# Patient Record
Sex: Female | Born: 1975
Health system: Southern US, Community
[De-identification: ages and names within clinical notes are randomized; demographics above are authoritative.]

## PROBLEM LIST (undated history)

## (undated) ENCOUNTER — Inpatient Hospital Stay (HOSPITAL_COMMUNITY): Payer: Self-pay

## (undated) DIAGNOSIS — R87613 High grade squamous intraepithelial lesion on cytologic smear of cervix (HGSIL): Secondary | ICD-10-CM

## (undated) DIAGNOSIS — Z973 Presence of spectacles and contact lenses: Secondary | ICD-10-CM

## (undated) DIAGNOSIS — D060 Carcinoma in situ of endocervix: Secondary | ICD-10-CM

## (undated) DIAGNOSIS — Z87442 Personal history of urinary calculi: Secondary | ICD-10-CM

## (undated) HISTORY — PX: TONSILLECTOMY: SUR1361

---

## 2002-03-21 ENCOUNTER — Other Ambulatory Visit: Admission: RE | Admit: 2002-03-21 | Discharge: 2002-03-21 | Payer: Self-pay | Admitting: Obstetrics & Gynecology

## 2002-09-24 ENCOUNTER — Encounter: Payer: Self-pay | Admitting: Obstetrics and Gynecology

## 2002-09-24 ENCOUNTER — Ambulatory Visit (HOSPITAL_COMMUNITY): Admission: RE | Admit: 2002-09-24 | Discharge: 2002-09-24 | Payer: Self-pay | Admitting: Obstetrics and Gynecology

## 2003-03-31 ENCOUNTER — Other Ambulatory Visit: Admission: RE | Admit: 2003-03-31 | Discharge: 2003-03-31 | Payer: Self-pay | Admitting: Obstetrics and Gynecology

## 2003-11-03 ENCOUNTER — Inpatient Hospital Stay (HOSPITAL_COMMUNITY): Admission: AD | Admit: 2003-11-03 | Discharge: 2003-11-06 | Payer: Self-pay | Admitting: Obstetrics and Gynecology

## 2003-11-13 ENCOUNTER — Encounter: Admission: RE | Admit: 2003-11-13 | Discharge: 2003-12-13 | Payer: Self-pay | Admitting: Obstetrics and Gynecology

## 2011-06-11 ENCOUNTER — Encounter: Payer: Self-pay | Admitting: *Deleted

## 2011-06-11 ENCOUNTER — Ambulatory Visit (INDEPENDENT_AMBULATORY_CARE_PROVIDER_SITE_OTHER): Payer: BC Managed Care – PPO

## 2011-06-11 ENCOUNTER — Emergency Department (HOSPITAL_BASED_OUTPATIENT_CLINIC_OR_DEPARTMENT_OTHER)
Admission: EM | Admit: 2011-06-11 | Discharge: 2011-06-11 | Disposition: A | Discharge status: Home / Self Care | Payer: BC Managed Care – PPO | Attending: Emergency Medicine | Admitting: Emergency Medicine

## 2011-06-11 ENCOUNTER — Emergency Department (INDEPENDENT_AMBULATORY_CARE_PROVIDER_SITE_OTHER): Payer: BC Managed Care – PPO

## 2011-06-11 DIAGNOSIS — R112 Nausea with vomiting, unspecified: Secondary | ICD-10-CM

## 2011-06-11 DIAGNOSIS — N23 Unspecified renal colic: Secondary | ICD-10-CM

## 2011-06-11 DIAGNOSIS — R1011 Right upper quadrant pain: Secondary | ICD-10-CM

## 2011-06-11 DIAGNOSIS — N200 Calculus of kidney: Secondary | ICD-10-CM

## 2011-06-11 DIAGNOSIS — R109 Unspecified abdominal pain: Secondary | ICD-10-CM

## 2011-06-11 DIAGNOSIS — N390 Urinary tract infection, site not specified: Secondary | ICD-10-CM | POA: Insufficient documentation

## 2011-06-11 LAB — BASIC METABOLIC PANEL
BUN: 13 mg/dL (ref 6–23)
Calcium: 9.1 mg/dL (ref 8.4–10.5)
Chloride: 102 mEq/L (ref 96–112)
Creatinine, Ser: 0.6 mg/dL (ref 0.50–1.10)
GFR calc Af Amer: >90 mL/min (ref >90)
GFR calc non Af Amer: >90 mL/min (ref >90)

## 2011-06-11 LAB — DIFFERENTIAL
Basophils Absolute: 0 10*3/uL (ref 0.0–0.1)
Basophils Relative: 0 % (ref 0–1)
Eosinophils Absolute: 0 10*3/uL (ref 0.0–0.7)
Monocytes Absolute: 0.6 10*3/uL (ref 0.1–1.0)
Neutro Abs: 7.2 10*3/uL (ref 1.7–7.7)

## 2011-06-11 LAB — PREGNANCY, URINE: Preg Test, Ur: NEGATIVE

## 2011-06-11 LAB — CBC
HCT: 39.2 % (ref 36.0–46.0)
MCH: 28.7 pg (ref 26.0–34.0)
MCHC: 33.9 g/dL (ref 30.0–36.0)
RDW: 12.5 % (ref 11.5–15.5)

## 2011-06-11 LAB — URINE MICROSCOPIC-ADD ON

## 2011-06-11 LAB — URINALYSIS, ROUTINE W REFLEX MICROSCOPIC
Glucose, UA: NEGATIVE mg/dL
Protein, ur: 30 mg/dL — AB
Specific Gravity, Urine: 1.031 — ABNORMAL HIGH (ref 1.005–1.030)
pH: 6 (ref 5.0–8.0)

## 2011-06-11 MED ORDER — IBUPROFEN 600 MG PO TABS: 600.0000 mg | ORAL_TABLET | Freq: Three times a day (TID) | ORAL | Status: AC | PRN

## 2011-06-11 MED ORDER — CEPHALEXIN 250 MG PO CAPS
500.0000 mg | ORAL_CAPSULE | Freq: Four times a day (QID) | ORAL | Status: DC
  Administered 2011-06-11: 500 mg via ORAL

## 2011-06-11 MED ORDER — ONDANSETRON HCL 8 MG PO TABS: 8.0000 mg | ORAL_TABLET | Freq: Three times a day (TID) | ORAL | Status: AC | PRN

## 2011-06-11 MED ORDER — HYDROCODONE-ACETAMINOPHEN 5-325 MG PO TABS: 2.0000 | ORAL_TABLET | ORAL | Status: AC | PRN

## 2011-06-11 MED ORDER — CEPHALEXIN 250 MG PO CAPS
ORAL_CAPSULE | ORAL | Status: AC
  Administered 2011-06-11: 500 mg via ORAL
  Filled 2011-06-11: qty 2

## 2011-06-11 MED ORDER — CEPHALEXIN 500 MG PO CAPS: 500.0000 mg | ORAL_CAPSULE | Freq: Four times a day (QID) | ORAL | Status: AC

## 2011-06-11 MED ORDER — HYDROCODONE-ACETAMINOPHEN 5-325 MG PO TABS
2.0000 | ORAL_TABLET | Freq: Once | ORAL | Status: AC
  Administered 2011-06-11: 2 via ORAL

## 2011-06-11 MED ORDER — HYDROCODONE-ACETAMINOPHEN 5-325 MG PO TABS
ORAL_TABLET | ORAL | Status: AC
  Administered 2011-06-11: 2 via ORAL
  Filled 2011-06-11: qty 2

## 2011-06-11 NOTE — ED Notes (Signed)
Pt sent here from Urgent Care in [place] for / kidney stone. Had KUB, urine and urine preg done there. C/O lower back and abd pain, nausea. Given Toradol and Zofran at the UC.

## 2011-06-11 NOTE — ED Notes (Signed)
At d/c pt requested medication for pain- vorb from [facility] obtained for hydrocodone at bedside- pt d/c home with ride- rx x 4 for zofran, hydrocodone, keflex and ibuprofen given

## 2011-06-11 NOTE — ED Provider Notes (Signed)
History   This chart was scribed for Felisa Bonier, MD by Charolett Bumpers . The patient was seen in room MH12/MH12 and the patient's care was started at 8:05pm.  CSN: [id number]  Arrival date & time 06/11/11  1901   First MD Initiated Contact with Patient 06/11/11 1954      Chief Complaint  Patient presents with  . Abdominal Pain    (Consider location/radiation/quality/duration/timing/severity/associated sxs/prior treatment) HPI Taylor Chaney is a 36 y.o. female who presents to the Emergency Department complaining of constant, moderate pain in her lower abdomen that radiates to the lower back bilaterally that started last night. She reports having associated vomiting, diarrhea, and nausea. Patient describes the pain as sharp and radiating. Patient denies dysuria, hematuria, and urinary urgency. Patient notes having a h/o kidney stones.    Past Medical History  Diagnosis Date  . Kidney stone     History reviewed. No pertinent past surgical history.  History reviewed. No pertinent family history.  History  Substance Use Topics  . Smoking status: Never Smoker   . Smokeless tobacco: Not on file  . Alcohol Use: Yes    OB History    Grav Para Term Preterm Abortions TAB SAB Ect Mult Living                  Review of Systems A complete 10 system review of systems was obtained and is otherwise negative except as noted in the HPI and PMH.   Allergies  Review of patient's allergies indicates no known allergies.  Home Medications   Current Outpatient Rx  Name Route Sig Dispense Refill  . KETOROLAC TROMETHAMINE 30 MG/ML IJ SOLN Intravenous Inject 30 mg into the vein once.      Darlis Loan ESTRAD TRIPHASIC 0.18/0.215/0.25 MG-35 MCG PO TABS Oral Take 1 tablet by mouth daily.        BP 108/70  Pulse 92  Temp(Src) 97.6 F (36.4 C) (Oral)  Resp 16  Ht 4\' 10"  (1.473 m)  Wt 96 lb (43.545 kg)  BMI 20.06 kg/m2  SpO2 100%  LMP 05/24/2011  Physical Exam    Nursing note and vitals reviewed. Constitutional: She is oriented to person, place, and time. She appears well-developed and well-nourished. No distress.  HENT:  Head: Normocephalic and atraumatic.  Eyes: EOM are normal. Pupils are equal, round, and reactive to light.  Neck: Neck supple. No tracheal deviation present.  Cardiovascular: Normal rate, regular rhythm and normal heart sounds.   Pulmonary/Chest: Effort normal and breath sounds normal. No respiratory distress.       Clear lung sounds bilaterally  Abdominal: Soft. Bowel sounds are normal. She exhibits no distension. There is tenderness (Mid-abdominal tenderness bilaterally). There is no rebound and no guarding.       CVA tenderness bilaterally.  Musculoskeletal: Normal range of motion. She exhibits no edema.  Neurological: She is alert and oriented to person, place, and time. No sensory deficit.  Skin: Skin is warm and dry.  Psychiatric: She has a normal mood and affect. Her behavior is normal.    ED Course  Procedures (including critical care time)  DIAGNOSTIC STUDIES: Oxygen Saturation is 100% on room air, normal by my interpretation.    COORDINATION OF CARE:     Labs Reviewed  URINALYSIS, ROUTINE W REFLEX MICROSCOPIC - Abnormal; Notable for the following:    Color, Urine AMBER (*) BIOCHEMICALS MAY BE AFFECTED BY COLOR   APPearance CLOUDY (*)    Specific Gravity, Urine 1.031 (*)  Hgb urine dipstick SMALL (*)    Bilirubin Urine SMALL (*)    Ketones, ur 40 (*)    Protein, ur 30 (*)    Leukocytes, UA SMALL (*)    All other components within normal limits  CBC - Abnormal; Notable for the following:    Platelets 404 (*)    All other components within normal limits  DIFFERENTIAL - Abnormal; Notable for the following:    Neutrophils Relative 84 (*)    Lymphocytes Relative 9 (*)    All other components within normal limits  URINE MICROSCOPIC-ADD ON - Abnormal; Notable for the following:    Squamous Epithelial /  LPF MANY (*)    Bacteria, UA MANY (*)    All other components within normal limits  PREGNANCY, URINE  BASIC METABOLIC PANEL   Ct Abdomen Pelvis Wo Contrast  06/11/2011  *RADIOLOGY REPORT*  Clinical Data: Bilateral flank pain.  History of urinary tract calculi.  Nausea and vomiting.  CT ABDOMEN AND PELVIS WITHOUT CONTRAST 06/11/2011:  Technique:  Multidetector CT imaging of the abdomen and pelvis was performed following the standard protocol without intravenous contrast.  Comparison: None.  Findings: Hyperdense renal tubules involving both kidneys, with a very small (approximate 2-3 mm) calculus in a lower pole calix of the right kidney, and very small (1-2 mm) calculi in mid and lower pole calyces of the left kidney.  No evidence of ureteral calculi or urinary tract obstruction on either side.  Phleboliths in the lower left pelvis.  Within the limits of the unenhanced technique, no focal parenchymal abnormality involving either kidney.  Normal low dose unenhanced appearance of the liver, spleen, pancreas, and adrenal glands.  Anatomic variant in that the left lobe of the liver extends well across the midline into the left upper quadrant.  Gallbladder contracted, as there is food within the normal-appearing stomach.  No biliary ductal dilation.  No visible aorto-iliofemoral atherosclerosis.  No significant lymphadenopathy.  Normal-appearing stomach and small bowel.  Liquid stool within normal appearing colon.  Appendix not clearly identified, but no pericecal inflammation.  No ascites.  Urinary bladder decompressed and unremarkable.  Uterus and ovaries unremarkable for age.  Bone window images unremarkable.  Visualized lung bases clear.  Heart size normal.  IMPRESSION:  1.  Non-obstructing very small bilateral renal calculi.  No evidence of urinary tract obstruction on either side. 2.  Hyperdense renal tubules consistent with renal tubular ectasia. 3.  No acute abnormalities involving the abdomen or pelvis.   Original Report Authenticated By: Arnell Sieving, M.D.     No diagnosis found.    MDM  Kidney stone vs. Urinary tract infection   I personally performed the services described in this documentation, which was scribed in my presence. The recorded information has been reviewed and considered.       Felisa Bonier, MD 06/11/11 2227

## 2011-06-13 LAB — URINE CULTURE
Colony Count: NO GROWTH
Culture  Setup Time: 201301070205
Culture: NO GROWTH

## 2012-06-05 NOTE — L&D Delivery Note (Signed)
Operative Delivery Note At 2:29 PM a viable and healthy female was delivered via Vaginal, Vacuum [employer], operational).  Presentation: vertex; Position: Left,, Occiput,, Anterior; Station: +4.  Verbal consent: obtained from patient.  Risks and benefits discussed in detail.  Risks include, but are not limited to the risks of anesthesia, bleeding, infection, damage to maternal tissues, fetal cephalhematoma.  There is also the risk of inability to effect vaginal delivery of the head, or shoulder dystocia that cannot be resolved by established maneuvers, leading to the need for emergency cesarean section.  APGAR: 8, 9; weight pending. Placenta status: Intact, Spontaneous.   Cord: 3 vessels with the following complications: None.  Cord pH: na  Anesthesia: Epidural  Instruments: Kiwi cup x 2pulls and one pop off Episiotomy: Median Lacerations: None Suture Repair: 2.0 vicryl rapide Est. Blood Loss (mL): 400  Mom to postpartum.  Baby to nursery-stable.  Quinlin Conant J 02/05/2013, 2:47 PM

## 2012-12-07 ENCOUNTER — Inpatient Hospital Stay (HOSPITAL_COMMUNITY)
Admission: AD | Admit: 2012-12-07 | Discharge: 2012-12-07 | Disposition: A | Discharge status: Home / Self Care | Payer: BC Managed Care – PPO | Source: Ambulatory Visit | Attending: Obstetrics & Gynecology | Admitting: Obstetrics & Gynecology

## 2012-12-07 ENCOUNTER — Encounter (HOSPITAL_COMMUNITY): Payer: Self-pay | Admitting: *Deleted

## 2012-12-07 ENCOUNTER — Inpatient Hospital Stay (HOSPITAL_COMMUNITY): Payer: BC Managed Care – PPO

## 2012-12-07 DIAGNOSIS — O4410 Placenta previa with hemorrhage, unspecified trimester: Secondary | ICD-10-CM | POA: Insufficient documentation

## 2012-12-07 LAB — CBC
MCHC: 33.5 g/dL (ref 30.0–36.0)
Platelets: 275 10*3/uL (ref 150–400)
RDW: 14.1 % (ref 11.5–15.5)
WBC: 11.9 10*3/uL — ABNORMAL HIGH (ref 4.0–10.5)

## 2012-12-07 LAB — URINALYSIS, ROUTINE W REFLEX MICROSCOPIC
Bilirubin Urine: NEGATIVE
Ketones, ur: 15 mg/dL — AB
Leukocytes, UA: NEGATIVE
Nitrite: NEGATIVE
Protein, ur: NEGATIVE mg/dL
pH: 6.5 (ref 5.0–8.0)

## 2012-12-07 LAB — URINE MICROSCOPIC-ADD ON

## 2012-12-07 MED ORDER — METRONIDAZOLE 500 MG PO TABS: 500.0000 mg | ORAL_TABLET | Freq: Two times a day (BID) | ORAL | Status: DC

## 2012-12-07 MED ORDER — TERCONAZOLE 0.4 % VA CREA: 1.0000 | TOPICAL_CREAM | Freq: Every day | VAGINAL | Status: DC

## 2012-12-07 MED ORDER — LACTATED RINGERS IV BOLUS (SEPSIS)
1000.0000 mL | Freq: Once | INTRAVENOUS | Status: AC
  Administered 2012-12-07: 1000 mL via INTRAVENOUS

## 2012-12-07 MED ORDER — FERROUS SULFATE 325 (65 FE) MG PO TABS: 325.0000 mg | ORAL_TABLET | Freq: Every day | ORAL | Status: DC

## 2012-12-07 NOTE — MAU Provider Note (Signed)
S: 37 y.o. G2P1 @[redacted]w[redacted]d  presents to MAU with report of bright red vaginal bleeding at home this morning,enough to fill the toilet and when she wiped.  She reports spotting in her underwear only upon arrival in MAU. She had a known placenta previa which resolved to low-lying placenta on recent ultrasound.  She reports good fetal movement, denies LOF, vaginal itching/burning, urinary symptoms, h/a, dizziness, n/v, or fever/chills.    O: BP 110/77  Pulse 90  Physical Examination: General appearance - alert, well appearing, and in no distress and oriented to person, place, and time Chest - clear to auscultation, no wheezes, rales or rhonchi, symmetric air entry Heart - normal rate, regular rhythm, normal S1, S2, no murmurs, rubs, clicks or gallops  FHR baseline 120 with moderate variability, accels present (15x15), no decels Occasional contraction on toco, mild to palpation  A: Vaginal bleeding with low-lying placenta  P: Medical screening performed RN to call pt attending Labs and  U/S pending  Sharen Counter Certified Nurse-Midwife

## 2012-12-07 NOTE — H&P (Addendum)
37 yo G5P1031 (MAB x 2, IUI conception w/ SVD, MAB, current pregnancy) at 30'1 for evaluation of vaginal bleeding. Pt notes waking up at 5:30 am to void and noticed pink in toilet water. Pt called after hours and told to come in. Good FM, feels very rare/ short contraction at night. No ROM. No vag d/c. No abdominal pain. Just bleeding early this am, IC last Thursday- 2 days ago.   PN Issues: - placenta previa, moved to low lying placenta vs partial previa at 28 wks and repeat u/s planned. - O positive  PMH: none PSH: none PGynHx: no h/o endometriosis, HPV on pap this pregnancy, planning eval PP Meds: PNV All: NKDA SH: getting married tomorrow  PE: Filed Vitals:   12/07/12 0614  BP: 110/77  Pulse: 90   Gen: well appearng, no distress CV: RRR Pulm: CTAB Abd: soft, gravid, NT, no RUQ pain LE: NT, no edema GU: mod amount thicker white d/c, not particularly malodorous, no blood, no pink, cvx visually long/ closed  Wet prep: numerous WBC, no yeast seen, no trich, clue cells present- 80%, rare lactobacilli, no RBC seen, sperm present Toco: very rare ctx FH: 140's, + accels, no decels, 10 beat var  A/P: 37 yo [id number] with c/o vaginal spotting and h/o low lying placenta - still w/ low lying placenta, no evidence previa, cvx long. No evidence PTL. No evidence abruption. No abdominal pain. Likely related to recent IC and possibly dehydration. Given pt will be busy today and tomorrow with rehearsal dinner and wedding, will give 1L of fluids. Pt advised to drink as much as possible and rest when she can. - mild anemia. Iron recc - BV. Will treat, will give Terazol to prevent yeast. Check GC/Ct given WBC on wet prep - Reactive fetal testing.   Javed Cotto A. 12/07/2012 9:13 AM   Prior to discharge pt had another void with significant hematuria. Unclear whether genital or urinary in etiology. Further history obtained, no back pain but h/o kidney stone in past. Repeat SSE done, no blood in  vagina, no cervical bleeding. Cath UA done and blood noted. Probably small kidney stone causing hematuria. Will send UA, Ucx, pt advised for aggressive hydration, return if pain.  Cath UA: verbal consent obtained, betadine on urethra, sterile gloves, small cath inserted and sample obtained. Pt tol well.   Zarria Towell A. 12/07/2012 10:15 AM

## 2012-12-07 NOTE — MAU Note (Signed)
Pt reports vaginal bleeding when she awakened at 0530, no pain. History of low lying placenta.

## 2012-12-08 LAB — GC/CHLAMYDIA PROBE AMP
CT Probe RNA: NEGATIVE
GC Probe RNA: NEGATIVE

## 2012-12-08 LAB — URINE CULTURE: Culture: NO GROWTH

## 2013-02-05 ENCOUNTER — Encounter (HOSPITAL_COMMUNITY): Payer: Self-pay | Admitting: Anesthesiology

## 2013-02-05 ENCOUNTER — Encounter (HOSPITAL_COMMUNITY): Payer: Self-pay

## 2013-02-05 ENCOUNTER — Inpatient Hospital Stay (HOSPITAL_COMMUNITY): Payer: BC Managed Care – PPO | Admitting: Anesthesiology

## 2013-02-05 ENCOUNTER — Inpatient Hospital Stay (HOSPITAL_COMMUNITY)
Admission: AD | Admit: 2013-02-05 | Discharge: 2013-02-07 | DRG: 373 | Disposition: A | Discharge status: Home / Self Care | Payer: BC Managed Care – PPO | Source: Ambulatory Visit | Attending: Obstetrics and Gynecology | Admitting: Obstetrics and Gynecology

## 2013-02-05 DIAGNOSIS — G35 Multiple sclerosis: Secondary | ICD-10-CM | POA: Diagnosis present

## 2013-02-05 DIAGNOSIS — D62 Acute posthemorrhagic anemia: Secondary | ICD-10-CM | POA: Diagnosis not present

## 2013-02-05 DIAGNOSIS — O09529 Supervision of elderly multigravida, unspecified trimester: Secondary | ICD-10-CM | POA: Diagnosis present

## 2013-02-05 DIAGNOSIS — Z86711 Personal history of pulmonary embolism: Secondary | ICD-10-CM

## 2013-02-05 DIAGNOSIS — O9903 Anemia complicating the puerperium: Secondary | ICD-10-CM | POA: Diagnosis not present

## 2013-02-05 LAB — CBC
MCHC: 34.8 g/dL (ref 30.0–36.0)
Platelets: 301 10*3/uL (ref 150–400)
RDW: 14.8 % (ref 11.5–15.5)
WBC: 12.6 10*3/uL — ABNORMAL HIGH (ref 4.0–10.5)

## 2013-02-05 LAB — OB RESULTS CONSOLE RUBELLA ANTIBODY, IGM: Rubella: IMMUNE

## 2013-02-05 LAB — OB RESULTS CONSOLE GC/CHLAMYDIA
Chlamydia: NEGATIVE
Gonorrhea: NEGATIVE

## 2013-02-05 LAB — RPR: RPR Ser Ql: NONREACTIVE

## 2013-02-05 LAB — TYPE AND SCREEN
ABO/RH(D): O POS
Antibody Screen: NEGATIVE

## 2013-02-05 LAB — OB RESULTS CONSOLE HEPATITIS B SURFACE ANTIGEN: Hepatitis B Surface Ag: NEGATIVE

## 2013-02-05 LAB — OB RESULTS CONSOLE HIV ANTIBODY (ROUTINE TESTING): HIV: NONREACTIVE

## 2013-02-05 MED ORDER — METHYLERGONOVINE MALEATE 0.2 MG PO TABS: 0.2000 mg | ORAL_TABLET | ORAL | Status: DC | PRN

## 2013-02-05 MED ORDER — PHENYLEPHRINE 40 MCG/ML (10ML) SYRINGE FOR IV PUSH (FOR BLOOD PRESSURE SUPPORT)
80.0000 ug | PREFILLED_SYRINGE | INTRAVENOUS | Status: DC | PRN
  Filled 2013-02-05: qty 2
  Filled 2013-02-05: qty 5

## 2013-02-05 MED ORDER — IBUPROFEN 600 MG PO TABS
600.0000 mg | ORAL_TABLET | Freq: Four times a day (QID) | ORAL | Status: DC
  Administered 2013-02-06 – 2013-02-07 (×6): 600 mg via ORAL
  Filled 2013-02-05 (×6): qty 1

## 2013-02-05 MED ORDER — PRENATAL MULTIVITAMIN CH
1.0000 | ORAL_TABLET | Freq: Every day | ORAL | Status: DC
  Administered 2013-02-06: 1 via ORAL
  Filled 2013-02-05: qty 1

## 2013-02-05 MED ORDER — ONDANSETRON HCL 4 MG PO TABS: 4.0000 mg | ORAL_TABLET | ORAL | Status: DC | PRN

## 2013-02-05 MED ORDER — CITRIC ACID-SODIUM CITRATE 334-500 MG/5ML PO SOLN: 30.0000 mL | ORAL | Status: DC | PRN

## 2013-02-05 MED ORDER — DIBUCAINE 1 % RE OINT: 1.0000 "application " | TOPICAL_OINTMENT | RECTAL | Status: DC | PRN

## 2013-02-05 MED ORDER — EPHEDRINE 5 MG/ML INJ
10.0000 mg | INTRAVENOUS | Status: DC | PRN
  Filled 2013-02-05: qty 2
  Filled 2013-02-05: qty 4

## 2013-02-05 MED ORDER — OXYCODONE-ACETAMINOPHEN 5-325 MG PO TABS
1.0000 | ORAL_TABLET | ORAL | Status: DC | PRN
  Administered 2013-02-05: 2 via ORAL
  Filled 2013-02-05: qty 2

## 2013-02-05 MED ORDER — LACTATED RINGERS IV SOLN: 500.0000 mL | INTRAVENOUS | Status: DC | PRN

## 2013-02-05 MED ORDER — IBUPROFEN 600 MG PO TABS
600.0000 mg | ORAL_TABLET | Freq: Four times a day (QID) | ORAL | Status: DC | PRN
  Administered 2013-02-05: 600 mg via ORAL
  Filled 2013-02-05: qty 1

## 2013-02-05 MED ORDER — BUTORPHANOL TARTRATE 1 MG/ML IJ SOLN: 1.0000 mg | INTRAMUSCULAR | Status: DC | PRN

## 2013-02-05 MED ORDER — FENTANYL 2.5 MCG/ML BUPIVACAINE 1/10 % EPIDURAL INFUSION (WH - ANES)
14.0000 mL/h | INTRAMUSCULAR | Status: DC | PRN
  Administered 2013-02-05: 12 mL/h via EPIDURAL
  Administered 2013-02-05: 14 mL/h via EPIDURAL
  Filled 2013-02-05 (×2): qty 125

## 2013-02-05 MED ORDER — SENNOSIDES-DOCUSATE SODIUM 8.6-50 MG PO TABS
2.0000 | ORAL_TABLET | Freq: Every day | ORAL | Status: DC
  Administered 2013-02-05 – 2013-02-06 (×2): 2 via ORAL

## 2013-02-05 MED ORDER — OXYTOCIN 40 UNITS IN LACTATED RINGERS INFUSION - SIMPLE MED
62.5000 mL/h | INTRAVENOUS | Status: DC
  Filled 2013-02-05: qty 1000

## 2013-02-05 MED ORDER — OXYTOCIN BOLUS FROM INFUSION: 500.0000 mL | INTRAVENOUS | Status: DC

## 2013-02-05 MED ORDER — DIPHENHYDRAMINE HCL 50 MG/ML IJ SOLN: 12.5000 mg | INTRAMUSCULAR | Status: DC | PRN

## 2013-02-05 MED ORDER — LACTATED RINGERS IV SOLN
INTRAVENOUS | Status: DC
  Administered 2013-02-05 (×2): via INTRAVENOUS

## 2013-02-05 MED ORDER — LACTATED RINGERS IV SOLN
500.0000 mL | Freq: Once | INTRAVENOUS | Status: AC
  Administered 2013-02-05: 500 mL via INTRAVENOUS

## 2013-02-05 MED ORDER — SIMETHICONE 80 MG PO CHEW: 80.0000 mg | CHEWABLE_TABLET | ORAL | Status: DC | PRN

## 2013-02-05 MED ORDER — OXYCODONE-ACETAMINOPHEN 5-325 MG PO TABS
1.0000 | ORAL_TABLET | ORAL | Status: DC | PRN
  Administered 2013-02-05: 2 via ORAL
  Administered 2013-02-06 – 2013-02-07 (×3): 1 via ORAL
  Filled 2013-02-05 (×2): qty 1
  Filled 2013-02-05: qty 2
  Filled 2013-02-05: qty 1

## 2013-02-05 MED ORDER — DIPHENHYDRAMINE HCL 25 MG PO CAPS: 25.0000 mg | ORAL_CAPSULE | Freq: Four times a day (QID) | ORAL | Status: DC | PRN

## 2013-02-05 MED ORDER — BENZOCAINE-MENTHOL 20-0.5 % EX AERO
1.0000 "application " | INHALATION_SPRAY | CUTANEOUS | Status: DC | PRN
  Filled 2013-02-05: qty 56

## 2013-02-05 MED ORDER — TETANUS-DIPHTH-ACELL PERTUSSIS 5-2.5-18.5 LF-MCG/0.5 IM SUSP: 0.5000 mL | Freq: Once | INTRAMUSCULAR | Status: DC

## 2013-02-05 MED ORDER — TERBUTALINE SULFATE 1 MG/ML IJ SOLN: 0.2500 mg | Freq: Once | INTRAMUSCULAR | Status: DC | PRN

## 2013-02-05 MED ORDER — FLEET ENEMA 7-19 GM/118ML RE ENEM: 1.0000 | ENEMA | RECTAL | Status: DC | PRN

## 2013-02-05 MED ORDER — ZOLPIDEM TARTRATE 5 MG PO TABS: 5.0000 mg | ORAL_TABLET | Freq: Every evening | ORAL | Status: DC | PRN

## 2013-02-05 MED ORDER — WITCH HAZEL-GLYCERIN EX PADS: 1.0000 "application " | MEDICATED_PAD | CUTANEOUS | Status: DC | PRN

## 2013-02-05 MED ORDER — EPHEDRINE 5 MG/ML INJ
10.0000 mg | INTRAVENOUS | Status: DC | PRN
  Filled 2013-02-05: qty 2

## 2013-02-05 MED ORDER — ONDANSETRON HCL 4 MG/2ML IJ SOLN: 4.0000 mg | INTRAMUSCULAR | Status: DC | PRN

## 2013-02-05 MED ORDER — OXYTOCIN 40 UNITS IN LACTATED RINGERS INFUSION - SIMPLE MED
1.0000 m[IU]/min | INTRAVENOUS | Status: DC
  Administered 2013-02-05: 2 m[IU]/min via INTRAVENOUS

## 2013-02-05 MED ORDER — LIDOCAINE HCL (PF) 1 % IJ SOLN
INTRAMUSCULAR | Status: DC | PRN
  Administered 2013-02-05: 4 mL
  Administered 2013-02-05: 2 mL
  Administered 2013-02-05 (×2): 4 mL

## 2013-02-05 MED ORDER — METHYLERGONOVINE MALEATE 0.2 MG/ML IJ SOLN: 0.2000 mg | INTRAMUSCULAR | Status: DC | PRN

## 2013-02-05 MED ORDER — PHENYLEPHRINE 40 MCG/ML (10ML) SYRINGE FOR IV PUSH (FOR BLOOD PRESSURE SUPPORT)
80.0000 ug | PREFILLED_SYRINGE | INTRAVENOUS | Status: DC | PRN
  Filled 2013-02-05: qty 2

## 2013-02-05 MED ORDER — ONDANSETRON HCL 4 MG/2ML IJ SOLN: 4.0000 mg | Freq: Four times a day (QID) | INTRAMUSCULAR | Status: DC | PRN

## 2013-02-05 MED ORDER — LIDOCAINE HCL (PF) 1 % IJ SOLN
30.0000 mL | INTRAMUSCULAR | Status: AC | PRN
  Administered 2013-02-05: 30 mL via SUBCUTANEOUS
  Filled 2013-02-05 (×2): qty 30

## 2013-02-05 MED ORDER — LANOLIN HYDROUS EX OINT: TOPICAL_OINTMENT | CUTANEOUS | Status: DC | PRN

## 2013-02-05 MED ORDER — ACETAMINOPHEN 325 MG PO TABS: 650.0000 mg | ORAL_TABLET | ORAL | Status: DC | PRN

## 2013-02-05 NOTE — H&P (Signed)
Taylor Chaney, Taylor Chaney               ACCOUNT NO.:  [id number]  MEDICAL RECORD NO.:  [id number]  LOCATION:  9109                          FACILITY:  [facility]  PHYSICIAN:  Lenoard Aden, M.D.DATE OF BIRTH:  1975/07/06  DATE OF ADMISSION:  02/05/2013 DATE OF DISCHARGE:                             HISTORY & PHYSICAL   CHIEF COMPLAINT:  Labor.  HISTORY OF PRESENT ILLNESS:  A 37 year old Asian female, G5, P1 at 60 and 5/7th weeks' gestation who presents in active labor.  MEDICATIONS:  Prenatal vitamins and iron.  ALLERGIES:  She has no known drug allergies.  SOCIAL HISTORY:  Noncontributory.  FAMILY HISTORY:  Noncontributory.  MEDICAL HISTORY:  Remarkable for multiple sclerosis.  SURGICAL HISTORY:  Noncontributory.  Pregnancy is complicated by acute pulmonary embolism.   CANCELLED DICTATION.     Lenoard Aden, M.D.     RJT/MEDQ  D:  02/05/2013  T:  02/05/2013  Job:  [id number]

## 2013-02-05 NOTE — H&P (Signed)
Taylor Chaney, Taylor Chaney               ACCOUNT NO.:  [id number]  MEDICAL RECORD NO.:  [id number]  LOCATION:  9109                          FACILITY:  [facility]  PHYSICIAN:  Lenoard Aden, M.D.DATE OF BIRTH:  [birth year]  DATE OF ADMISSION:  02/05/2013 DATE OF DISCHARGE:                             HISTORY & PHYSICAL   CHIEF COMPLAINT:  Labor.  HISTORY OF PRESENT ILLNESS:  A 37 year old Asian female G5, P1, at 51- 5/7th weeks, in active labor.  ALLERGIES:  She has no known drug allergies.  MEDICATIONS:  Prenatal vitamins and iron.  SOCIAL HISTORY:  She is a nonsmoker, nondrinker.  She denies domestic or physical violence.  FAMILY HISTORY:  Noncontributory.  OBSTETRICAL HISTORY:  Pregnancy is remarkable for 3 SABs and 1 uncomplicated vaginal delivery.  PAST SURGICAL HISTORY:  Noncontributory.  PRENATAL COURSE:  Complicated by placenta previa which has resolved at 34 weeks.  PHYSICAL EXAMINATION:  GENERAL:  She is a well-developed, well-nourished female, in no acute distress. HEENT:  Normal. NECK:  Supple.  Full range of motion. LUNGS:  Clear. HEART:  Regular rate and rhythm. ABDOMEN:  Soft, gravid, nontender.  Estimated fetal weight 7.5 pounds. Cervix is 4 to 5 cm, 80%, vertex -1. EXTREMITIES:  There are no cords. NEUROLOGIC:  Nonfocal. SKIN:  Intact.  IMPRESSION:  Term intrauterine pregnancy, in active labor.  PLAN:  Anticipate attempts at vaginal delivery.     Lenoard Aden, M.D.     RJT/MEDQ  D:  02/05/2013  T:  02/05/2013  Job:  [id number]

## 2013-02-05 NOTE — Progress Notes (Signed)
Taylor Chaney is a 37 y.o. G5P1031 at [redacted]w[redacted]d by LMP admitted for active labor  Subjective: comfortable  Objective: BP 114/67  Pulse 99  Temp(Src) 97.2 F (36.2 C) (Axillary)  Resp 18  Ht 4\' 10"  (1.473 m)  Wt 61.689 kg (136 lb)  BMI 28.43 kg/m2  SpO2 99%      FHT:  FHR: 155 bpm, variability: moderate,  accelerations:  Present,  decelerations:  Absent UC:   irregular, every 2-4 minutes SVE:   Dilation: 6.5 Effacement (%): 90 Station: -1 Exam by:: Taylor Lebeda, MD AROM- clear  Labs: Lab Results  Component Value Date   WBC 12.6* 02/05/2013   HGB 11.9* 02/05/2013   HCT 34.2* 02/05/2013   MCV 86.1 02/05/2013   PLT 301 02/05/2013    Assessment / Plan: Spontaneous labor, progressing normally  Labor: Progressing normally Preeclampsia:  labs stable Fetal Wellbeing:  Category I Pain Control:  Epidural I/D:  n/a Anticipated MOD:  NSVD  Taylor Chaney J 02/05/2013, 8:55 AM

## 2013-02-05 NOTE — Anesthesia Procedure Notes (Signed)
Epidural Patient location during procedure: OB Start time: 02/05/2013 4:02 AM  Staffing Performed by: anesthesiologist   Preanesthetic Checklist Completed: patient identified, site marked, surgical consent, pre-op evaluation, timeout performed, IV checked, risks and benefits discussed and monitors and equipment checked  Epidural Patient position: sitting Prep: site prepped and draped and DuraPrep Patient monitoring: continuous pulse ox and blood pressure Approach: midline Injection technique: LOR air  Needle:  Needle type: Tuohy  Needle gauge: 17 G Needle length: 9 cm and 9 Needle insertion depth: 5 cm cm Catheter type: closed end flexible Catheter size: 19 Gauge Catheter at skin depth: 10 cm Test dose: negative  Assessment Events: blood not aspirated, injection not painful, no injection resistance, negative IV test and no paresthesia  Additional Notes Discussed risk of headache, infection, bleeding, nerve injury and failed or incomplete block.  Patient voices understanding and wishes to proceed.  Epidural placed easily on first attempt.  No paresthesia.  Patient tolerated procedure well with no apparent complications.  Jasmine December, MD Reason for block:procedure for pain

## 2013-02-05 NOTE — Progress Notes (Signed)
Taylor Chaney is a 37 y.o. G5P1031 at [redacted]w[redacted]d by LMP admitted for active labor  Subjective: Getting epidural  Objective: BP 100/74  Pulse 93  Temp(Src) 99 F (37.2 C) (Oral)  Resp 18  Ht 4\' 10"  (1.473 m)  Wt 61.689 kg (136 lb)  BMI 28.43 kg/m2  SpO2 99%      FHT:  FHR: 150 bpm, variability: moderate,  accelerations:  Present,  decelerations:  Absent UC:   regular, every 4 minutes SVE:   Dilation: 4.5 Effacement (%): 90 Station: -1 Exam by:: AL Rinehart RN  Labs: Lab Results  Component Value Date   WBC 12.6* 02/05/2013   HGB 11.9* 02/05/2013   HCT 34.2* 02/05/2013   MCV 86.1 02/05/2013   PLT 301 02/05/2013    Assessment / Plan: Spontaneous labor, progressing normally History of previa with resolution  Labor: Progressing normally Preeclampsia:  labs stable Fetal Wellbeing:  Category I Pain Control:  Epidural I/D:  n/a Anticipated MOD:  NSVD  Taylor Chaney J 02/05/2013, 6:52 AM

## 2013-02-05 NOTE — Progress Notes (Signed)
Satoria Dunlop is a 37 y.o. G5P1031 at [redacted]w[redacted]d by LMP admitted for active labor  Subjective: Feels pressure  Objective: BP 126/89  Pulse 98  Temp(Src) 98.3 F (36.8 C) (Oral)  Resp 18  Ht 4\' 10"  (1.473 m)  Wt 61.689 kg (136 lb)  BMI 28.43 kg/m2  SpO2 99%      FHT:  FHR: 155 bpm, variability: moderate,  accelerations:  Present,  decelerations:  Present mild variables with contractions. UC:   regular, every 2 minutes SVE:   10/100/+1  Labs: Lab Results  Component Value Date   WBC 12.6* 02/05/2013   HGB 11.9* 02/05/2013   HCT 34.2* 02/05/2013   MCV 86.1 02/05/2013   PLT 301 02/05/2013    Assessment / Plan: Spontaneous labor, progressing normally  Labor: Progressing normally Preeclampsia:  labs stable Fetal Wellbeing:  Category I Pain Control:  Epidural I/D:  n/a Anticipated MOD:  NSVD  British Moyd J 02/05/2013, 12:13 PM

## 2013-02-05 NOTE — MAU Note (Signed)
Contractions tonight. Denies LOF. Some bloody show on toilet paper and towel on bed. Positive fetal movement. Cervix was closed last week.

## 2013-02-05 NOTE — Anesthesia Preprocedure Evaluation (Signed)
Anesthesia Evaluation  Patient identified by MRN, date of birth, ID band Patient awake    Reviewed: Allergy & Precautions, H&P , NPO status , Patient's Chart, lab work & pertinent test results, reviewed documented beta blocker date and time   History of Anesthesia Complications Negative for: history of anesthetic complications  Airway Mallampati: III TM Distance: >3 FB Neck ROM: full    Dental  (+) Teeth Intact   Pulmonary neg pulmonary ROS,  breath sounds clear to auscultation        Cardiovascular negative cardio ROS  Rhythm:regular Rate:Normal     Neuro/Psych negative neurological ROS  negative psych ROS   GI/Hepatic negative GI ROS, Neg liver ROS,   Endo/Other  negative endocrine ROS  Renal/GU negative Renal ROS  negative genitourinary   Musculoskeletal   Abdominal   Peds  Hematology negative hematology ROS (+)   Anesthesia Other Findings   Reproductive/Obstetrics (+) Pregnancy                          Anesthesia Physical Anesthesia Plan  ASA: II  Anesthesia Plan: Epidural   Post-op Pain Management:    Induction:   Airway Management Planned:   Additional Equipment:   Intra-op Plan:   Post-operative Plan:   Informed Consent: I have reviewed the patients History and Physical, chart, labs and discussed the procedure including the risks, benefits and alternatives for the proposed anesthesia with the patient or authorized representative who has indicated his/her understanding and acceptance.     Plan Discussed with:   Anesthesia Plan Comments:         Anesthesia Quick Evaluation  

## 2013-02-06 DIAGNOSIS — D62 Acute posthemorrhagic anemia: Secondary | ICD-10-CM | POA: Diagnosis not present

## 2013-02-06 LAB — CBC
MCHC: 34.2 g/dL (ref 30.0–36.0)
RDW: 14.9 % (ref 11.5–15.5)

## 2013-02-06 MED ORDER — POLYSACCHARIDE IRON COMPLEX 150 MG PO CAPS
150.0000 mg | ORAL_CAPSULE | Freq: Every day | ORAL | Status: DC
  Administered 2013-02-06 – 2013-02-07 (×2): 150 mg via ORAL
  Filled 2013-02-06 (×2): qty 1

## 2013-02-06 MED ORDER — DOCUSATE SODIUM 100 MG PO CAPS
100.0000 mg | ORAL_CAPSULE | Freq: Every day | ORAL | Status: DC
  Administered 2013-02-06 – 2013-02-07 (×2): 100 mg via ORAL
  Filled 2013-02-06 (×2): qty 1

## 2013-02-06 NOTE — Anesthesia Postprocedure Evaluation (Signed)
Anesthesia Post Note  Patient: Taylor Chaney  Procedure(s) Performed: * No procedures listed *  Anesthesia type: Epidural  Patient location: Mother/Baby  Post pain: Pain level controlled  Post assessment: Post-op Vital signs reviewed  Last Vitals:  Filed Vitals:   02/06/13 0642  BP: 100/66  Pulse: 73  Temp: 36.6 C  Resp: 18    Post vital signs: Reviewed  Level of consciousness: awake  Complications: No apparent anesthesia complications

## 2013-02-06 NOTE — Progress Notes (Signed)
PPD 1 SVD  S:  Reports feeling well - a little tired             Tolerating po/ No nausea or vomiting             Bleeding is light             Pain controlled with motrin and percocet             Up ad lib / ambulatory / voiding QS  Newborn breast feeding  / Circumcision done this AM (reviewed care and anticipatory guidance for recovery)  O:               VS: BP 100/66  Pulse 73  Temp(Src) 97.9 F (36.6 C) (Oral)  Resp 18  Ht 4\' 10"  (1.473 m)  Wt 61.689 kg (136 lb)  BMI 28.43 kg/m2  SpO2 97%   LABS:              Recent Labs  02/05/13 0220 02/06/13 0605  WBC 12.6* 18.1*  HGB 11.9* 9.5*  PLT 301 237               Blood type: --/--/O POS (09/03 0700)  Rubella: Immune (09/03 1532)                            Physical Exam:             Alert and oriented X3  Lungs: Clear and unlabored  Heart: regular rate and rhythm / no mumurs  Abdomen: soft, non-tender, non-distended              Fundus: firm, non-tender, U-1  Perineum: moderate edema - ice pack in place  Lochia: light  Extremities: no edema, no calf pain or tenderness    A: PPD # 1 VAVD              Mild ABL anemia   Doing well - stable status  P: Routine post partum orders  Anticipate DC tomorrow  Marlinda Mike CNM, MSN, [facility] 02/06/2013, 9:11 AM

## 2013-02-07 MED ORDER — OXYCODONE-ACETAMINOPHEN 5-325 MG PO TABS: 1.0000 | ORAL_TABLET | ORAL | Status: DC | PRN

## 2013-02-07 MED ORDER — DSS 100 MG PO CAPS: 100.0000 mg | ORAL_CAPSULE | Freq: Two times a day (BID) | ORAL | Status: DC | PRN

## 2013-02-07 MED ORDER — IBUPROFEN 600 MG PO TABS: 600.0000 mg | ORAL_TABLET | Freq: Four times a day (QID) | ORAL | Status: DC | PRN

## 2013-02-07 NOTE — Progress Notes (Signed)
Patient ID: Taylor Chaney, female   DOB: June 25, 1975, 37 y.o.   MRN: [id number] PPD # 2  Subjective: Pt reports feeling well and eager for d/c home/ Pain controlled with ibuprofen and percocet Tolerating po/ Voiding without problems/ No n/v Bleeding is light/ Newborn info:  Information for the patient's newborn:  Taylor, Chaney [[id number]]  female Feeding: breast    Objective:  VS: Blood pressure 91/58, pulse 58, temperature 97.6 F (36.4 C), temperature source Oral, resp. rate 18, height 4\' 10"  (1.473 m), weight 61.689 kg (136 lb), SpO2 97.00%, unknown if currently breastfeeding.    Recent Labs  02/05/13 0220 02/06/13 0605  WBC 12.6* 18.1*  HGB 11.9* 9.5*  HCT 34.2* 27.8*  PLT 301 237    Blood type: --/--/O POS (09/03 0700) Rubella: Immune (09/03 1532)    Physical Exam:  General: A & O x 3  alert, cooperative and no distress CV: Regular rate and rhythm Resp: clear Abdomen: soft, nontender, normal bowel sounds Uterine Fundus: firm, below umbilicus, nontender Perineum: healing with good reapproximation Lochia: minimal Ext: edema trace and Homans sign is negative, no sign of DVT    A/P: PPD # 2/ [id number]/ S/P: VAVD with epis Mild ABL anemia Doing well and stable for discharge home RX: Ibuprofen 600mg  po Q 6 hrs prn pain #30 Refill x 1 Percocet 5/325 1 to 2 po Q 4 hrs prn pain #15 No refill Colace 100mg  BID prn constipation #30 WOB/GYN booklet given Routine pp visit in 6wks   Taylor Revel, MSN, [facility] 02/07/2013, 8:51 AM

## 2013-02-07 NOTE — Discharge Summary (Signed)
Obstetric Discharge Summary Reason for Admission: G5 P1 0 3 1 @ 38w 5d with onset active labor Prenatal Procedures: NST and ultrasound Intrapartum Procedures: vacuum and episiotomy midline Postpartum Procedures: none Complications-Operative and Postpartum: none Hemoglobin  Date Value Range Status  02/06/2013 9.5* 12.0 - 15.0 g/dL Final     DELTA CHECK NOTED     REPEATED TO VERIFY     HCT  Date Value Range Status  02/06/2013 27.8* 36.0 - 46.0 % Final    Physical Exam:  General: alert, cooperative and no distress Lochia: appropriate Uterine Fundus: firm Incision: healing well DVT Evaluation: No evidence of DVT seen on physical exam.  Discharge Diagnoses: G5 P2 0 3 2 s/p VAVD with midline episiotomy Mild ABL Anemia  Discharge Information: Date: 02/07/2013 Activity: pelvic rest Diet: routine Medications: PNV, Ibuprofen, Colace and Percocet, iron supplement Condition: stable Instructions: refer to practice specific booklet Discharge to: home Follow-up Information   Follow up with Lenoard Aden, MD In 6 weeks.   Specialty:  Obstetrics and Gynecology   Contact information:   [address] [place] [address] [phone]       Newborn Data: Live born female on 02/05/13 Birth Weight: 8 lb 0.6 oz (3646 g) APGAR: 8, 9  Home with mother.  Miriam Liles K 02/07/2013, 9:26 AM

## 2013-02-17 ENCOUNTER — Ambulatory Visit (HOSPITAL_COMMUNITY)
Admission: RE | Admit: 2013-02-17 | Discharge: 2013-02-17 | Disposition: A | Discharge status: Home / Self Care | Payer: BC Managed Care – PPO | Source: Ambulatory Visit | Attending: Obstetrics & Gynecology | Admitting: Obstetrics & Gynecology

## 2013-02-17 NOTE — Lactation Note (Signed)
Adult Lactation Consultation Outpatient Visit Note  Patient Name: Taylor Chaney Date of Birth: September 09, 1975 Gestational Age at Delivery: Unknown Type of Delivery:   Breastfeeding History: Frequency of Breastfeeding: q 2-3 hours Length of Feeding: 20  inutes Voids: QS Stools: QS  Supplementing / Method: Pumping:  Type of Pump:   Frequency:  Volume:    Comments: Has manual pump but reports that it hurts when she pumps. Encouraged to use larger flange and see if that helps    Consultation Evaluation:  Initial Feeding Assessment: Pre-feed Weight: 8- 8.2  3860g Post-feed Weight: 8-10  3912g Amount Transferred: 52 cc's Comments: Assisted mom with latch. She reports that it has been pinching but nipples are getting better. Had been cracked and sore. Now pink but intact. Wearing shells at home. Has comfort gels but reports they didn't really help. Does not want a new set. [name] wants to tuck bottom lip under. When corrected mom reports that feels fine. Encouraged to keep [name] close to the breast throughout the feeding. Lots of swallows noted.  Additional Feeding Assessment: Pre-feed Weight: 8- 10  3912g Post-feed Weight: 8- 10.2  3918g Amount Transferred:6 Comments: Assisted mom in football hold. Suggested changing positions to help nipple to heal. After the first few minutes and adjusting his bottom lip, mom reports that feels fine. [name] nursed for about 10 minutes then going to sleep. Mom had nursed him before coming to appointment for 5 minutes because he was so fussy. Reviewed sore nipple care. No further questions at present. To call prn   Total Breast milk Transferred this Visit: 58 cc's Total Supplement Given: 0  Additional Interventions:    Follow-Up With Ped To call here prn.      Pamelia Hoit 02/17/2013, 3:22 PM

## 2013-03-19 ENCOUNTER — Ambulatory Visit (HOSPITAL_COMMUNITY)
Admission: RE | Admit: 2013-03-19 | Discharge: 2013-03-19 | Disposition: A | Discharge status: Home / Self Care | Payer: BC Managed Care – PPO | Source: Ambulatory Visit | Attending: Obstetrics and Gynecology | Admitting: Obstetrics and Gynecology

## 2013-03-19 NOTE — Lactation Note (Addendum)
Adult Lactation Consultation Outpatient Visit Note  Patient Name: Taylor Chaney Date of Birth: 05-Mar-1976 Gestational Age at Delivery: Unknown Type of Delivery: Vaginal delivery 02/05/2013 - BW - 8.06 g                                                               24 weeks old - 8-9 oz                                                               No weight since                                                               Today's weight - 10-10.9 oz  Per mom reason for visit today - concerned weather [name] is latching deep enough , transitioning back to work, and milk supply.  Breastfeeding History: Frequency of Breastfeeding: days every 2 1/2 - 3 hours , and nights can go 5 hours. Length of Feeding: 10 mins each  Voids: >6 Stools: >2-3 yellowish   Supplementing / Method: Pumping:  Type of Pump:DEBP Medela pump    Frequency: X3   Volume:  30 ml at the most   Comments:Introduced bottle X 1 ( Medela - 3 oz , tolerated well per mom   Consultation Evaluation:both breast are full , not engorged , nipples appear healthy , no break down noted   Last fed at 1215 for 10 mins each breast per mom   Initial Feeding Assessment: fed 10 mins on the left  Pre-feed Weight: 10-10.9 oz 4846g  Post-feed Weight: 10.11.9 oz 4872 g  Amount Transferred: 26 ml  Comments: Mom independent with latch with depth , multiply swallows and gulps noted intermittently with the feeding, nipple appeared normal when baby released .   Additional Feeding Assessment:fed for 13 mins on the right breast .  Pre-feed Weight: 10.11.9 oz 4872 g  Post-feed Weight:10.13.9 oz 4930g  Amount Transferred:52 ml  Comments:latched for 13 mins , noted a consistent swallowing pattern with multiply swallows, increased with breast compressions , baby released on is own, nipple appeared normal shape.   Additional Feeding Assessment: Wet diaper changed  Pre-feed Weight:10.13.3 oz 4914g  Post-feed Weight: Amount  Transferred: Comments:relatched on the right breast ( mom independent with latch ), few sucks and released  Total Breast milk Transferred this Visit: 78 ml  Total Supplement Given: none   Lactation Plan of Care - Praised mom for her efforts breast feeding and bonding with her baby                                         - Always when breast feeding soften 1st breast prior to switching to the 2nd breast                                         -  Transitioning for flexibility with breast feeding , working from home and also having to travel to [place] Due to work                                         - Pump after am feeding and midday , save milk , let dad feed the bottle in the early evening around dinner and pump both breast for 15 - 20 mins and save milk.                                         - work on not allowing "[name] " to hang out at the breast                                         - Extra pumping with increase milk volume [name] is growing nicely approx 2 pounds in a month ) , for flexibility and returning back to work the extra pumping is indicated.                                         - ( per mom would like to be able to freeze some milk   Follow-Up Per mom 04/07/2013 with [facility] ,                     - LC recommended considering attending the [facility] on Tuesday's at 11am , or the 2nd of 4th Monday evening at [facility]       Kathrin Greathouse 03/19/2013, 3:10 PM

## 2014-04-06 ENCOUNTER — Encounter (HOSPITAL_COMMUNITY): Payer: Self-pay

## 2014-04-08 ENCOUNTER — Ambulatory Visit (HOSPITAL_COMMUNITY): Payer: BC Managed Care – PPO | Admitting: Anesthesiology

## 2014-04-08 ENCOUNTER — Encounter (HOSPITAL_COMMUNITY): Payer: Self-pay

## 2014-04-08 ENCOUNTER — Encounter (HOSPITAL_COMMUNITY)
Admission: AD | Disposition: A | Discharge status: Home / Self Care | Payer: Self-pay | Source: Ambulatory Visit | Attending: Obstetrics and Gynecology

## 2014-04-08 ENCOUNTER — Ambulatory Visit (HOSPITAL_COMMUNITY)
Admission: AD | Admit: 2014-04-08 | Discharge: 2014-04-08 | Disposition: A | Discharge status: Home / Self Care | Payer: BC Managed Care – PPO | Source: Ambulatory Visit | Attending: Obstetrics and Gynecology | Admitting: Obstetrics and Gynecology

## 2014-04-08 DIAGNOSIS — O021 Missed abortion: Secondary | ICD-10-CM | POA: Diagnosis present

## 2014-04-08 HISTORY — PX: DILATION AND EVACUATION: SHX1459

## 2014-04-08 LAB — CBC
HEMATOCRIT: 38.1 % (ref 36.0–46.0)
HEMOGLOBIN: 13 g/dL (ref 12.0–15.0)
MCH: 29.6 pg (ref 26.0–34.0)
MCHC: 34.1 g/dL (ref 30.0–36.0)
MCV: 86.8 fL (ref 78.0–100.0)
Platelets: 389 10*3/uL (ref 150–400)
RBC: 4.39 MIL/uL (ref 3.87–5.11)
RDW: 12.8 % (ref 11.5–15.5)
WBC: 10.7 10*3/uL — AB (ref 4.0–10.5)

## 2014-04-08 SURGERY — DILATION AND EVACUATION, UTERUS
Anesthesia: Monitor Anesthesia Care | Site: Uterus

## 2014-04-08 MED ORDER — LIDOCAINE HCL (CARDIAC) 20 MG/ML IV SOLN
INTRAVENOUS | Status: AC
  Filled 2014-04-08: qty 5

## 2014-04-08 MED ORDER — SCOPOLAMINE 1 MG/3DAYS TD PT72
MEDICATED_PATCH | TRANSDERMAL | Status: AC
  Filled 2014-04-08: qty 1

## 2014-04-08 MED ORDER — MIDAZOLAM HCL 2 MG/2ML IJ SOLN
INTRAMUSCULAR | Status: DC | PRN
  Administered 2014-04-08: 2 mg via INTRAVENOUS

## 2014-04-08 MED ORDER — LIDOCAINE HCL (CARDIAC) 20 MG/ML IV SOLN
INTRAVENOUS | Status: DC | PRN
  Administered 2014-04-08: 20 mg via INTRAVENOUS

## 2014-04-08 MED ORDER — LACTATED RINGERS IV SOLN
INTRAVENOUS | Status: DC
  Administered 2014-04-08: 125 mL/h via INTRAVENOUS
  Administered 2014-04-08: 11:00:00 via INTRAVENOUS

## 2014-04-08 MED ORDER — PROPOFOL 10 MG/ML IV EMUL
INTRAVENOUS | Status: AC
  Filled 2014-04-08: qty 40

## 2014-04-08 MED ORDER — KETOROLAC TROMETHAMINE 30 MG/ML IJ SOLN
INTRAMUSCULAR | Status: AC
  Filled 2014-04-08: qty 1

## 2014-04-08 MED ORDER — KETOROLAC TROMETHAMINE 30 MG/ML IJ SOLN
INTRAMUSCULAR | Status: DC | PRN
  Administered 2014-04-08: 30 mg via INTRAVENOUS

## 2014-04-08 MED ORDER — PROPOFOL INFUSION 10 MG/ML OPTIME
INTRAVENOUS | Status: DC | PRN
  Administered 2014-04-08: 140 ug/kg/min via INTRAVENOUS

## 2014-04-08 MED ORDER — PROPOFOL 10 MG/ML IV EMUL
INTRAVENOUS | Status: DC | PRN
  Administered 2014-04-08: 20 mg via INTRAVENOUS
  Administered 2014-04-08: 10 mg via INTRAVENOUS
  Administered 2014-04-08 (×4): 20 mg via INTRAVENOUS

## 2014-04-08 MED ORDER — OXYCODONE-ACETAMINOPHEN 5-325 MG PO TABS: 1.0000 | ORAL_TABLET | ORAL | Status: DC | PRN

## 2014-04-08 MED ORDER — SCOPOLAMINE 1 MG/3DAYS TD PT72
1.0000 | MEDICATED_PATCH | Freq: Once | TRANSDERMAL | Status: DC
  Administered 2014-04-08: 1.5 mg via TRANSDERMAL

## 2014-04-08 MED ORDER — MIDAZOLAM HCL 2 MG/2ML IJ SOLN
INTRAMUSCULAR | Status: AC
  Filled 2014-04-08: qty 2

## 2014-04-08 MED ORDER — CEFAZOLIN SODIUM-DEXTROSE 2-3 GM-% IV SOLR
2.0000 g | INTRAVENOUS | Status: AC
  Administered 2014-04-08: 2 g via INTRAVENOUS

## 2014-04-08 MED ORDER — DEXAMETHASONE SODIUM PHOSPHATE 10 MG/ML IJ SOLN
INTRAMUSCULAR | Status: AC
  Filled 2014-04-08: qty 1

## 2014-04-08 MED ORDER — FENTANYL CITRATE 0.05 MG/ML IJ SOLN
INTRAMUSCULAR | Status: DC | PRN
Start: 2014-04-08 — End: 2014-04-08
  Administered 2014-04-08: 25 ug via INTRAVENOUS
  Administered 2014-04-08: 50 ug via INTRAVENOUS
  Administered 2014-04-08: 25 ug via INTRAVENOUS

## 2014-04-08 MED ORDER — ONDANSETRON HCL 4 MG/2ML IJ SOLN
INTRAMUSCULAR | Status: DC | PRN
  Administered 2014-04-08: 4 mg via INTRAVENOUS

## 2014-04-08 MED ORDER — BUPIVACAINE HCL (PF) 0.25 % IJ SOLN
INTRAMUSCULAR | Status: DC | PRN
  Administered 2014-04-08: 20 mL

## 2014-04-08 MED ORDER — ONDANSETRON HCL 4 MG/2ML IJ SOLN
INTRAMUSCULAR | Status: AC
  Filled 2014-04-08: qty 2

## 2014-04-08 MED ORDER — CEFAZOLIN SODIUM-DEXTROSE 2-3 GM-% IV SOLR
INTRAVENOUS | Status: AC
  Filled 2014-04-08: qty 50

## 2014-04-08 MED ORDER — FENTANYL CITRATE 0.05 MG/ML IJ SOLN: 25.0000 ug | INTRAMUSCULAR | Status: DC | PRN

## 2014-04-08 MED ORDER — DEXAMETHASONE SODIUM PHOSPHATE 4 MG/ML IJ SOLN
INTRAMUSCULAR | Status: DC | PRN
  Administered 2014-04-08: 4 mg via INTRAVENOUS

## 2014-04-08 MED ORDER — BUPIVACAINE HCL (PF) 0.25 % IJ SOLN
INTRAMUSCULAR | Status: AC
  Filled 2014-04-08: qty 30

## 2014-04-08 SURGICAL SUPPLY — 16 items
CATH ROBINSON RED A/P 16FR (CATHETERS) ×2 IMPLANT
CLOTH BEACON ORANGE TIMEOUT ST (SAFETY) ×2 IMPLANT
DECANTER SPIKE VIAL GLASS SM (MISCELLANEOUS) ×2 IMPLANT
GLOVE BIO SURGEON STRL SZ7.5 (GLOVE) ×2 IMPLANT
GOWN STRL REUS W/TWL LRG LVL3 (GOWN DISPOSABLE) ×4 IMPLANT
KIT BERKELEY 1ST TRIMESTER 3/8 (MISCELLANEOUS) ×2 IMPLANT
NS IRRIG 1000ML POUR BTL (IV SOLUTION) ×2 IMPLANT
PACK VAGINAL MINOR WOMEN LF (CUSTOM PROCEDURE TRAY) ×2 IMPLANT
PAD OB MATERNITY 4.3X12.25 (PERSONAL CARE ITEMS) ×2 IMPLANT
PAD PREP 24X48 CUFFED NSTRL (MISCELLANEOUS) ×2 IMPLANT
SET BERKELEY SUCTION TUBING (SUCTIONS) ×2 IMPLANT
TOWEL OR 17X24 6PK STRL BLUE (TOWEL DISPOSABLE) ×4 IMPLANT
VACURETTE 10 RIGID CVD (CANNULA) IMPLANT
VACURETTE 7MM CVD STRL WRAP (CANNULA) IMPLANT
VACURETTE 8 RIGID CVD (CANNULA) IMPLANT
VACURETTE 9 RIGID CVD (CANNULA) ×1 IMPLANT

## 2014-04-08 NOTE — Op Note (Signed)
NAMEBRITYN, MASTROGIOVANNI NO.:  [id number]  MEDICAL RECORD NO.:  [id number]  LOCATION:  [facility]                          FACILITY:  [facility]  PHYSICIAN:  Lovenia Kim, M.D.DATE OF BIRTH:  September 11, 1975  DATE OF PROCEDURE: DATE OF DISCHARGE:                              OPERATIVE REPORT   PREOPERATIVE DIAGNOSIS:  Missed abortion.  POSTOPERATIVE DIAGNOSIS:  Missed abortion.  PROCEDURE:  Suction D and E.  SURGEON:  Lovenia Kim, MD  ASSISTANT:  None.  ANESTHESIA:  IV sedation and local.  ESTIMATED BLOOD LOSS:  Less than 50 mL.  COMPLICATIONS:  None.  DRAINS:  None.  COUNTS:  Correct.  SPECIMEN:  Products of conception to Pathology.  DISPOSITION:  The patient to recovery in good condition.  BRIEF OPERATIVE NOTE:  After being apprised of the risks of anesthesia, infection, bleeding, injury to surrounding organs, possible need for repair, delayed versus immediate complications to include bowel and bladder injury, possible need for repair.  PROCEDURE IN DETAIL:  Patient was brought to the operating room where she was administered IV sedation without difficulty, prepped and draped in a sterile fashion, catheterized until the bladder was empty.  Exam under anesthesia reveals a 9-10 week anteflexed uterus.  No adnexal masses.  Dilute Marcaine solution placed, 20 mL total standard paracervical block.  Cervix easily dilated up to 27 Pratt dilator.  A 9 mm suction curette placed.  Products of conception aspirated, visualized, and sent to Pathology.  Repeat suction and curettage, bluntly in a 4-quadrant method reveals the cavity to be empty.  Good hemostasis noted.  The procedure terminated.  The patient tolerated this procedure well, and was transferred to recovery in good condition.     Lovenia Kim, M.D.     RJT/MEDQ  D:  04/08/2014  T:  04/08/2014  Job:  [id number]

## 2014-04-08 NOTE — Discharge Instructions (Signed)
DISCHARGE INSTRUCTIONS: D&C / D&E The following instructions have been prepared to help you care for yourself upon your return home.  No Ibuprofen containing products until after 5:00 pm this evening.   Personal hygiene:  Use sanitary pads for vaginal drainage, not tampons.  Shower the day after your procedure.  NO tub baths, pools or Jacuzzis for 2-3 weeks.  Wipe front to back after using the bathroom.  Activity and limitations:  Do NOT drive or operate any equipment for 24 hours. The effects of anesthesia are still present and drowsiness may result.  Do NOT rest in bed all day.  Walking is encouraged.  Walk up and down stairs slowly.  You may resume your normal activity in one to two days or as indicated by your physician.  Sexual activity: NO intercourse for at least 2 weeks after the procedure, or as indicated by your physician.  Diet: Eat a light meal as desired this evening. You may resume your usual diet tomorrow.  Return to work: You may resume your work activities in one to two days or as indicated by your doctor.  What to expect after your surgery: Expect to have vaginal bleeding/discharge for 2-3 days and spotting for up to 10 days. It is not unusual to have soreness for up to 1-2 weeks. You may have a slight burning sensation when you urinate for the first day. Mild cramps may continue for a couple of days. You may have a regular period in 2-6 weeks.  Call your doctor for any of the following:  Excessive vaginal bleeding, saturating and changing one pad every hour.  Inability to urinate 6 hours after discharge from hospital.  Pain not relieved by pain medication.  Fever of 100.4 F or greater.  Unusual vaginal discharge or odor.   Call for an appointment:    Patients signature: ______________________  Nurses signature ________________________  Support person's signature_______________________

## 2014-04-08 NOTE — Progress Notes (Signed)
Patient ID: Taylor Chaney, female   DOB: 06/19/1975, 38 y.o.   MRN: [id number] Patient seen and examined. Consent witnessed and signed. No changes noted. Update completed.

## 2014-04-08 NOTE — Anesthesia Preprocedure Evaluation (Signed)
Anesthesia Evaluation  Patient identified by MRN, date of birth, ID band Patient awake    Reviewed: Allergy & Precautions, H&P , Patient's Chart, lab work & pertinent test results, reviewed documented beta blocker date and time   Airway Mallampati: II  TM Distance: >3 FB Neck ROM: full    Dental no notable dental hx.    Pulmonary  breath sounds clear to auscultation  Pulmonary exam normal       Cardiovascular Rhythm:regular Rate:Normal     Neuro/Psych    GI/Hepatic   Endo/Other    Renal/GU      Musculoskeletal   Abdominal   Peds  Hematology   Anesthesia Other Findings   Reproductive/Obstetrics                             Anesthesia Physical Anesthesia Plan  ASA: II  Anesthesia Plan: MAC   Post-op Pain Management:    Induction: Intravenous  Airway Management Planned: Mask and Natural Airway  Additional Equipment:   Intra-op Plan:   Post-operative Plan:   Informed Consent: I have reviewed the patients History and Physical, chart, labs and discussed the procedure including the risks, benefits and alternatives for the proposed anesthesia with the patient or authorized representative who has indicated his/her understanding and acceptance.   Dental Advisory Given  Plan Discussed with: CRNA and Surgeon  Anesthesia Plan Comments: (Discussed sedation and potential to need to place airway or ETT if warranted by clinical changes intra-operatively. We will start procedure as MAC.)        Anesthesia Quick Evaluation  

## 2014-04-08 NOTE — Anesthesia Postprocedure Evaluation (Signed)
  Anesthesia Post-op Note  Patient: Taylor Chaney  Procedure(s) Performed: Procedure(s): DILATATION AND EVACUATION (N/A) Patient is awake and responsive. Pain and nausea are reasonably well controlled. Vital signs are stable and clinically acceptable. Oxygen saturation is clinically acceptable. There are no apparent anesthetic complications at this time. Patient is ready for discharge.

## 2014-04-08 NOTE — Progress Notes (Signed)
I spent time with pt and her husband in PACU after her procedure.  They each spoke about their different experiences with coping with loss in the past and how they have been coping since finding out yesterday.  I provided grief education and grief support, as well as reflective listening and affirmation.    They received information for on-going support including Heartstrings and 1:1 Comfort support here.  [employer] Pager, [phone] 2:41 PM    04/08/14 1400  Clinical Encounter Type  Visited With Patient and family together  Visit Type Spiritual support  Referral From Nurse  Spiritual Encounters  Spiritual Needs Emotional;Grief support  Stress Factors  Patient Stress Factors Loss

## 2014-04-08 NOTE — Op Note (Signed)
04/08/2014  10:53 AM  PATIENT:  Taylor Chaney  38 y.o. female  PRE-OPERATIVE DIAGNOSIS:  Missed Abortion  POST-OPERATIVE DIAGNOSIS:  Missed Abortion  PROCEDURE:  Procedure(s): DILATATION AND EVACUATION  SURGEON:  Surgeon(s): Lovenia Kim, MD  ASSISTANTS: none   ANESTHESIA:   local and IV sedation  ESTIMATED BLOOD LOSS: minimall  DRAINS: none   LOCAL MEDICATIONS USED:  MARCAINE    and Amount: 20 ml  SPECIMEN:  Source of Specimen:  POC  DISPOSITION OF SPECIMEN:  PATHOLOGY  COUNTS:  YES  DICTATION #: [id number]  PLAN OF CARE: dc home  PATIENT DISPOSITION:  PACU - hemodynamically stable.

## 2014-04-08 NOTE — H&P (Signed)
Cipriana Biller is an 38 y.o. female. Presenting with MAB for E&E  Pertinent Gynecological History: Menses: flow is moderate Bleeding: none Contraception: none DES exposure: denies Blood transfusions: none Sexually transmitted diseases: no past history Previous GYN Procedures: DNC  Last mammogram: normal Date: na Last pap: normal Date: na OB History: G4, P2   Menstrual History: Menarche age: 42  No LMP recorded.    Past Medical History  Diagnosis Date  . Kidney stone     Past Surgical History  Procedure Laterality Date  . Tonsillectomy      History reviewed. No pertinent family history.  Social History:  reports that she has never smoked. She does not have any smokeless tobacco history on file. She reports that she does not drink alcohol or use illicit drugs.  Allergies: No Known Allergies  Prescriptions prior to admission  Medication Sig Dispense Refill Last Dose  . Prenatal Vit-Fe Fumarate-FA (PRENATAL MULTIVITAMIN) TABS Take 1 tablet by mouth daily at 12 noon.   Past Week at Unknown time  . docusate sodium 100 MG CAPS Take 100 mg by mouth 2 (two) times daily as needed for constipation. 60 capsule 1 More than a month at Unknown time  . ferrous sulfate (FERROUSUL) 325 (65 FE) MG tablet Take 1 tablet (325 mg total) by mouth daily with breakfast. 30 tablet 3 More than a month at Unknown time  . ibuprofen (ADVIL,MOTRIN) 600 MG tablet Take 1 tablet (600 mg total) by mouth every 6 (six) hours as needed for pain. 30 tablet 1 More than a month at Unknown time  . oxyCODONE-acetaminophen (PERCOCET/ROXICET) 5-325 MG per tablet Take 1-2 tablets by mouth every 4 (four) hours as needed. 20 tablet 0 More than a month at Unknown time    Review of Systems  All other systems reviewed and are negative.   Blood pressure 115/70, temperature 99 F (37.2 C), temperature source Oral, resp. rate 16, height 4\' 9"  (1.448 m), weight 48.535 kg (107 lb), SpO2 100 %, unknown if currently  breastfeeding. Physical Exam  Constitutional: She is oriented to person, place, and time. She appears well-developed and well-nourished.  HENT:  Head: Normocephalic and atraumatic.  Cardiovascular: Normal rate and regular rhythm.   Respiratory: Effort normal.  GI: Soft. Bowel sounds are normal.  Genitourinary: Vagina normal and uterus normal.  Musculoskeletal: Normal range of motion.  Neurological: She is alert and oriented to person, place, and time. She has normal reflexes.  Skin: Skin is warm and dry.  Psychiatric: She has a normal mood and affect.    No results found for this or any previous visit (from the past 24 hour(s)).  No results found.  Assessment/Plan: MAB Suction D&E Consent done. Risks vs benefits of other options discussed. Risks of procedure noted.  Kinleigh Nault J 04/08/2014, 9:59 AM

## 2014-04-08 NOTE — Transfer of Care (Signed)
Immediate Anesthesia Transfer of Care Note  Patient: Taylor Chaney  Procedure(s) Performed: Procedure(s): DILATATION AND EVACUATION (N/A)  Patient Location: PACU  Anesthesia Type:MAC  Level of Consciousness: awake, alert , oriented and patient cooperative  Airway & Oxygen Therapy: Patient Spontanous Breathing and Patient connected to nasal cannula oxygen  Post-op Assessment: Report given to PACU RN and Post -op Vital signs reviewed and stable  Post vital signs: Reviewed and stable  Complications: No apparent anesthesia complications

## 2014-04-09 ENCOUNTER — Encounter (HOSPITAL_COMMUNITY): Payer: Self-pay | Admitting: Obstetrics and Gynecology

## 2014-06-15 ENCOUNTER — Inpatient Hospital Stay (HOSPITAL_COMMUNITY)
Admission: AD | Admit: 2014-06-15 | Discharge: 2014-06-16 | Disposition: A | Discharge status: Home / Self Care | Payer: BLUE CROSS/BLUE SHIELD | Source: Ambulatory Visit | Attending: Obstetrics and Gynecology | Admitting: Obstetrics and Gynecology

## 2014-06-15 ENCOUNTER — Encounter (HOSPITAL_COMMUNITY): Payer: Self-pay | Admitting: *Deleted

## 2014-06-15 DIAGNOSIS — N840 Polyp of corpus uteri: Secondary | ICD-10-CM | POA: Diagnosis not present

## 2014-06-15 DIAGNOSIS — N938 Other specified abnormal uterine and vaginal bleeding: Secondary | ICD-10-CM | POA: Diagnosis present

## 2014-06-15 LAB — POCT PREGNANCY, URINE: PREG TEST UR: NEGATIVE

## 2014-06-15 NOTE — MAU Note (Signed)
Pt reports she had a sudden onset of heavy bleeding tonight. Reports she has been bleeding since Dec 30. Denies pain

## 2014-06-16 DIAGNOSIS — N938 Other specified abnormal uterine and vaginal bleeding: Secondary | ICD-10-CM | POA: Diagnosis not present

## 2014-06-16 NOTE — MAU Provider Note (Signed)
History     CSN: [id number]  Arrival date and time: 06/15/14 2230   None     Chief Complaint  Patient presents with  . Vaginal Bleeding   HPI Comments: [id number], non-pregnant female here for episode of heavy vaginal bleeding tonight @2200 . Reports heavy BRB into toilet with clots. No pain or cramping. Bleeding has been scant since arrival. No dizziness or SOB. H/o irregular vaginal bleeding since D&C for MAB 2 mos ago. Last sono revealed endometrial polyp. She is scheduled for sonohysterogram tomorrow.    Past Medical History  Diagnosis Date  . Kidney stone     Past Surgical History  Procedure Laterality Date  . Tonsillectomy    . Dilation and evacuation N/A 04/08/2014    Procedure: DILATATION AND EVACUATION;  Surgeon: Lovenia Kim, MD;  Location: [facility];  Service: Gynecology;  Laterality: N/A;    History reviewed. No pertinent family history.  History  Substance Use Topics  . Smoking status: Never Smoker   . Smokeless tobacco: Not on file  . Alcohol Use: No    Allergies: No Known Allergies  Prescriptions prior to admission  Medication Sig Dispense Refill Last Dose  . azithromycin (ZITHROMAX) 250 MG tablet Take 250-500 mg by mouth See admin instructions. Take 2 tablets the first day and 1 tablet daily til gone.   More than a month at Unknown time  . docusate sodium 100 MG CAPS Take 100 mg by mouth 2 (two) times daily as needed for constipation. (Patient not taking: Reported on 04/08/2014) 60 capsule 1 More than a month at Unknown time  . ferrous sulfate (FERROUSUL) 325 (65 FE) MG tablet Take 1 tablet (325 mg total) by mouth daily with breakfast. (Patient not taking: Reported on 04/08/2014) 30 tablet 3 More than a month at Unknown time  . ibuprofen (ADVIL,MOTRIN) 600 MG tablet Take 1 tablet (600 mg total) by mouth every 6 (six) hours as needed for pain. (Patient not taking: Reported on 04/08/2014) 30 tablet 1 More than a month at Unknown time  . oxyCODONE-acetaminophen  (PERCOCET/ROXICET) 5-325 MG per tablet Take 1-2 tablets by mouth every 4 (four) hours as needed. 20 tablet 0 More than a month at Unknown time  . Prenatal Vit-Fe Fumarate-FA (PRENATAL MULTIVITAMIN) TABS Take 1 tablet by mouth daily at 12 noon.   More than a month at Unknown time    Review of Systems  Constitutional: Negative.   HENT: Negative.   Eyes: Negative.   Respiratory: Negative.   Cardiovascular: Negative.   Gastrointestinal: Negative.   Genitourinary:       +VB  Musculoskeletal: Negative.   Skin: Negative.   Neurological: Negative.   Endo/Heme/Allergies: Negative.   Psychiatric/Behavioral: Negative.    Physical Exam   Blood pressure 122/84, pulse 84, temperature 98.4 F (36.9 C), temperature source Oral, resp. rate 16, height 4\' 9"  (1.448 m), weight 48.081 kg (106 lb), SpO2 98 %, unknown if currently breastfeeding.  Physical Exam  Constitutional: She is oriented to person, place, and time. She appears well-developed and well-nourished.  HENT:  Head: Normocephalic and atraumatic.  Neck: Normal range of motion. Neck supple.  Cardiovascular: Normal rate.   Respiratory: Effort normal.  GI: Soft. She exhibits no distension and no mass. There is no tenderness. There is no rebound and no guarding.  Genitourinary: Vagina normal and uterus normal.  Speculum: small drk red blood in vault, one nickel sized clot removed, no active bleeding from os Bimanual: no CMT, no enlargement, no tenderness, no  adnexal mass  Musculoskeletal: Normal range of motion.  Neurological: She is alert and oriented to person, place, and time.  Skin: Skin is warm and dry.  Psychiatric: She has a normal mood and affect.   UPT-neg MAU Course  Procedures  MDM n/a  Assessment and Plan  DUB Endometrial polyp  Discharge home Reasurrance bleeding can be irregular and unpredictable with polyps Bleeding precautions-call if soaking 1 pad/hr over 3 hrs, or feeling dizzy or lightheaded Maintain good  hydration and nutrition Keep appt tomorrow for sonohyst   Chioma Mukherjee, N 06/16/2014, 12:27 AM

## 2014-06-16 NOTE — Discharge Instructions (Signed)
Abnormal Uterine Bleeding Abnormal uterine bleeding can affect women at various stages in life, including teenagers, women in their reproductive years, pregnant women, and women who have reached menopause. Several kinds of uterine bleeding are considered abnormal, including:  Bleeding or spotting between periods.   Bleeding after sexual intercourse.   Bleeding that is heavier or more than normal.   Periods that last longer than usual.  Bleeding after menopause.  Many cases of abnormal uterine bleeding are minor and simple to treat, while others are more serious. Any type of abnormal bleeding should be evaluated by your health care provider. Treatment will depend on the cause of the bleeding. HOME CARE INSTRUCTIONS Monitor your condition for any changes. The following actions may help to alleviate any discomfort you are experiencing:  Avoid the use of tampons and douches as directed by your health care provider.  Change your pads frequently. You should get regular pelvic exams and Pap tests. Keep all follow-up appointments for diagnostic tests as directed by your health care provider.  SEEK MEDICAL CARE IF:   Your bleeding lasts more than 1 week.   You feel dizzy at times.  SEEK IMMEDIATE MEDICAL CARE IF:   You pass out.   You are changing pads every 15 to 30 minutes.   You have abdominal pain.  You have a fever.   You become sweaty or weak.   You are passing large blood clots from the vagina.   You start to feel nauseous and vomit. MAKE SURE YOU:   Understand these instructions.  Will watch your condition.  Will get help right away if you are not doing well or get worse. Document Released: 05/22/2005 Document Revised: 05/27/2013 Document Reviewed: 12/19/2012 Exit[employer] Patient Information 2015 [employer]. This information is not intended to replace advice given to you by your health care provider. Make sure you discuss any questions you have with your  health care provider.  

## 2014-06-18 ENCOUNTER — Other Ambulatory Visit: Payer: Self-pay | Admitting: Obstetrics and Gynecology

## 2014-06-18 ENCOUNTER — Encounter (HOSPITAL_COMMUNITY): Payer: Self-pay | Admitting: *Deleted

## 2014-06-21 NOTE — H&P (Signed)
Taylor Chaney, Taylor Chaney               ACCOUNT NO.:  [id number]  MEDICAL RECORD NO.:  [id number]  LOCATION:  [facility]                         FACILITY:  [facility]  PHYSICIAN:  Lovenia Kim, M.D.DATE OF BIRTH:  Jul 02, 1975  DATE OF ADMISSION:  06/22/2014 DATE OF DISCHARGE:                             HISTORY & PHYSICAL   PREOPERATIVE DIAGNOSIS:  Dysfunctional bleeding with structural lesion.  HISTORY OF PRESENT ILLNESS:  She is a 39 year old Asian female, G7, P2, who presents with dysfunctional bleeding and a structural lesion on saline sonohysterogram, probable endometrial polyps.  ALLERGIES:  She has no known drug allergies.  MEDICATIONS:  Prenatal vitamins.  FAMILY HISTORY:  Esophageal cancer.  History of 2 vaginal deliveries and 3 pregnancy losses.  SURGICAL HISTORY:  D and E in November for missed AB.  PHYSICAL EXAM:  GENERAL:  Well-developed, well-nourished female, in no acute distress. HEENT:  Normal. NECK:  Supple.  Full range of motion. LUNGS:  Clear. HEART:  Regular rate and rhythm. ABDOMEN:  Soft, nontender.  Pelvic exam reveals normal size uterus.  No adnexal masses. EXTREMITIES:  There are no cords. NEUROLOGIC:  Nonfocal. SKIN:  Intact.  IMPRESSION:  Dysfunctional uterine bleeding with structural lesion.  PLAN:  Proceed with diagnostic hysteroscopy, D and C, VersaPoint.  Risks of anesthesia, infection, bleeding, injury to surrounding organs, possible need for repair was discussed, delayed versus immediate complications to include bowel and bladder injury noted.  The patient acknowledges and wishes to proceed.     Lovenia Kim, M.D.     RJT/MEDQ  D:  06/21/2014  T:  06/21/2014  Job:  [id number]

## 2014-06-22 ENCOUNTER — Ambulatory Visit (HOSPITAL_COMMUNITY)
Admission: RE | Admit: 2014-06-22 | Discharge: 2014-06-22 | Disposition: A | Discharge status: Home / Self Care | Payer: BLUE CROSS/BLUE SHIELD | Source: Ambulatory Visit | Attending: Obstetrics and Gynecology | Admitting: Obstetrics and Gynecology

## 2014-06-22 ENCOUNTER — Ambulatory Visit (HOSPITAL_COMMUNITY): Payer: BLUE CROSS/BLUE SHIELD | Admitting: Anesthesiology

## 2014-06-22 ENCOUNTER — Encounter (HOSPITAL_COMMUNITY): Payer: Self-pay | Admitting: *Deleted

## 2014-06-22 ENCOUNTER — Encounter (HOSPITAL_COMMUNITY)
Admission: RE | Disposition: A | Discharge status: Home / Self Care | Payer: Self-pay | Source: Ambulatory Visit | Attending: Obstetrics and Gynecology

## 2014-06-22 DIAGNOSIS — N840 Polyp of corpus uteri: Secondary | ICD-10-CM | POA: Diagnosis not present

## 2014-06-22 DIAGNOSIS — N938 Other specified abnormal uterine and vaginal bleeding: Secondary | ICD-10-CM | POA: Diagnosis present

## 2014-06-22 DIAGNOSIS — Z9889 Other specified postprocedural states: Secondary | ICD-10-CM | POA: Insufficient documentation

## 2014-06-22 DIAGNOSIS — N920 Excessive and frequent menstruation with regular cycle: Secondary | ICD-10-CM | POA: Diagnosis present

## 2014-06-22 HISTORY — PX: DILITATION & CURRETTAGE/HYSTROSCOPY WITH VERSAPOINT RESECTION: SHX5571

## 2014-06-22 HISTORY — DX: Personal history of urinary calculi: Z87.442

## 2014-06-22 LAB — HCG, SERUM, QUALITATIVE: Preg, Serum: NEGATIVE

## 2014-06-22 LAB — CBC
HEMATOCRIT: 36.5 % (ref 36.0–46.0)
Hemoglobin: 12.4 g/dL (ref 12.0–15.0)
MCH: 29.5 pg (ref 26.0–34.0)
MCHC: 34 g/dL (ref 30.0–36.0)
MCV: 86.7 fL (ref 78.0–100.0)
Platelets: 429 10*3/uL — ABNORMAL HIGH (ref 150–400)
RBC: 4.21 MIL/uL (ref 3.87–5.11)
RDW: 12.2 % (ref 11.5–15.5)
WBC: 7.9 10*3/uL (ref 4.0–10.5)

## 2014-06-22 SURGERY — DILATATION & CURETTAGE/HYSTEROSCOPY WITH VERSAPOINT RESECTION
Anesthesia: General

## 2014-06-22 MED ORDER — KETOROLAC TROMETHAMINE 30 MG/ML IJ SOLN
INTRAMUSCULAR | Status: AC
Start: 2014-06-22 — End: 2014-06-22
  Filled 2014-06-22: qty 1

## 2014-06-22 MED ORDER — MIDAZOLAM HCL 2 MG/2ML IJ SOLN
INTRAMUSCULAR | Status: DC | PRN
  Administered 2014-06-22 (×2): 1 mg via INTRAVENOUS

## 2014-06-22 MED ORDER — SODIUM CHLORIDE 0.9 % IJ SOLN
INTRAMUSCULAR | Status: AC
  Filled 2014-06-22: qty 50

## 2014-06-22 MED ORDER — ONDANSETRON HCL 4 MG/2ML IJ SOLN
INTRAMUSCULAR | Status: DC | PRN
  Administered 2014-06-22: 4 mg via INTRAVENOUS

## 2014-06-22 MED ORDER — BUPIVACAINE HCL (PF) 0.25 % IJ SOLN
INTRAMUSCULAR | Status: DC | PRN
  Administered 2014-06-22: 20 mL

## 2014-06-22 MED ORDER — VASOPRESSIN 20 UNIT/ML IV SOLN
INTRAVENOUS | Status: AC
  Filled 2014-06-22: qty 1

## 2014-06-22 MED ORDER — FENTANYL CITRATE 0.05 MG/ML IJ SOLN
INTRAMUSCULAR | Status: AC
  Filled 2014-06-22: qty 2

## 2014-06-22 MED ORDER — LIDOCAINE HCL (CARDIAC) 20 MG/ML IV SOLN
INTRAVENOUS | Status: AC
Start: 2014-06-22 — End: 2014-06-22
  Filled 2014-06-22: qty 5

## 2014-06-22 MED ORDER — SODIUM CHLORIDE 0.9 % IR SOLN
Status: DC | PRN
  Administered 2014-06-22: 6000 mL

## 2014-06-22 MED ORDER — ONDANSETRON HCL 4 MG/2ML IJ SOLN: 4.0000 mg | Freq: Once | INTRAMUSCULAR | Status: DC | PRN

## 2014-06-22 MED ORDER — TRAMADOL HCL 50 MG PO TABS: 50.0000 mg | ORAL_TABLET | Freq: Four times a day (QID) | ORAL | Status: DC | PRN

## 2014-06-22 MED ORDER — VASOPRESSIN 20 UNIT/ML IV SOLN
INTRAVENOUS | Status: DC | PRN
  Administered 2014-06-22: 20 mL via INTRAMUSCULAR

## 2014-06-22 MED ORDER — GLYCOPYRROLATE 0.2 MG/ML IJ SOLN
INTRAMUSCULAR | Status: AC
Start: 2014-06-22 — End: 2014-06-22
  Filled 2014-06-22: qty 1

## 2014-06-22 MED ORDER — SCOPOLAMINE 1 MG/3DAYS TD PT72
1.0000 | MEDICATED_PATCH | Freq: Once | TRANSDERMAL | Status: AC
  Administered 2014-06-22: 1 via TRANSDERMAL
  Administered 2014-06-22: 1.5 mg via TRANSDERMAL

## 2014-06-22 MED ORDER — FENTANYL CITRATE 0.05 MG/ML IJ SOLN
INTRAMUSCULAR | Status: DC | PRN
  Administered 2014-06-22 (×2): 25 ug via INTRAVENOUS
  Administered 2014-06-22: 50 ug via INTRAVENOUS

## 2014-06-22 MED ORDER — CEFAZOLIN SODIUM-DEXTROSE 2-3 GM-% IV SOLR
2.0000 g | INTRAVENOUS | Status: AC
  Administered 2014-06-22: 2 g via INTRAVENOUS

## 2014-06-22 MED ORDER — FENTANYL CITRATE 0.05 MG/ML IJ SOLN: 25.0000 ug | INTRAMUSCULAR | Status: DC | PRN

## 2014-06-22 MED ORDER — LACTATED RINGERS IV SOLN
INTRAVENOUS | Status: DC
  Administered 2014-06-22 (×2): via INTRAVENOUS

## 2014-06-22 MED ORDER — LIDOCAINE HCL (CARDIAC) 20 MG/ML IV SOLN
INTRAVENOUS | Status: DC | PRN
  Administered 2014-06-22: 100 mg via INTRAVENOUS

## 2014-06-22 MED ORDER — ACETAMINOPHEN 160 MG/5ML PO SOLN
960.0000 mg | Freq: Four times a day (QID) | ORAL | Status: DC | PRN
  Administered 2014-06-22 (×2): 960 mg via ORAL

## 2014-06-22 MED ORDER — DEXAMETHASONE SODIUM PHOSPHATE 4 MG/ML IJ SOLN
INTRAMUSCULAR | Status: AC
  Filled 2014-06-22: qty 1

## 2014-06-22 MED ORDER — ONDANSETRON HCL 4 MG/2ML IJ SOLN
INTRAMUSCULAR | Status: AC
  Filled 2014-06-22: qty 2

## 2014-06-22 MED ORDER — SCOPOLAMINE 1 MG/3DAYS TD PT72
MEDICATED_PATCH | TRANSDERMAL | Status: AC
  Filled 2014-06-22: qty 1

## 2014-06-22 MED ORDER — GLYCOPYRROLATE 0.2 MG/ML IJ SOLN
INTRAMUSCULAR | Status: DC | PRN
  Administered 2014-06-22: 0.2 mg via INTRAVENOUS

## 2014-06-22 MED ORDER — PROPOFOL 10 MG/ML IV BOLUS
INTRAVENOUS | Status: AC
  Filled 2014-06-22: qty 20

## 2014-06-22 MED ORDER — ACETAMINOPHEN 160 MG/5ML PO SOLN
ORAL | Status: AC
  Filled 2014-06-22: qty 40.6

## 2014-06-22 MED ORDER — DEXAMETHASONE SODIUM PHOSPHATE 4 MG/ML IJ SOLN
INTRAMUSCULAR | Status: DC | PRN
  Administered 2014-06-22: 4 mg via INTRAVENOUS

## 2014-06-22 MED ORDER — BUPIVACAINE HCL (PF) 0.25 % IJ SOLN
INTRAMUSCULAR | Status: AC
  Filled 2014-06-22: qty 30

## 2014-06-22 MED ORDER — PROPOFOL 10 MG/ML IV BOLUS
INTRAVENOUS | Status: DC | PRN
  Administered 2014-06-22: 120 mg via INTRAVENOUS

## 2014-06-22 MED ORDER — MIDAZOLAM HCL 2 MG/2ML IJ SOLN
INTRAMUSCULAR | Status: AC
  Filled 2014-06-22: qty 2

## 2014-06-22 MED ORDER — KETOROLAC TROMETHAMINE 30 MG/ML IJ SOLN
INTRAMUSCULAR | Status: DC | PRN
  Administered 2014-06-22: 30 mg via INTRAVENOUS

## 2014-06-22 MED ORDER — MEPERIDINE HCL 25 MG/ML IJ SOLN: 6.2500 mg | INTRAMUSCULAR | Status: DC | PRN

## 2014-06-22 MED ORDER — CEFAZOLIN SODIUM-DEXTROSE 2-3 GM-% IV SOLR
INTRAVENOUS | Status: AC
  Filled 2014-06-22: qty 50

## 2014-06-22 SURGICAL SUPPLY — 17 items
CANISTER SUCT 3000ML (MISCELLANEOUS) ×3 IMPLANT
CATH ROBINSON RED A/P 16FR (CATHETERS) ×3 IMPLANT
CLOTH BEACON ORANGE TIMEOUT ST (SAFETY) ×3 IMPLANT
CONTAINER PREFILL 10% NBF 60ML (FORM) ×4 IMPLANT
ELECTRODE RT ANGLE VERSAPOINT (CUTTING LOOP) ×3 IMPLANT
GLOVE BIO SURGEON STRL SZ7 (GLOVE) ×2 IMPLANT
GLOVE BIO SURGEON STRL SZ7.5 (GLOVE) ×6 IMPLANT
GLOVE BIOGEL PI IND STRL 7.0 (GLOVE) IMPLANT
GLOVE BIOGEL PI INDICATOR 7.0 (GLOVE) ×4
GOWN STRL REUS W/TWL LRG LVL3 (GOWN DISPOSABLE) ×6 IMPLANT
PACK VAGINAL MINOR WOMEN LF (CUSTOM PROCEDURE TRAY) ×3 IMPLANT
PAD OB MATERNITY 4.3X12.25 (PERSONAL CARE ITEMS) ×3 IMPLANT
SYR TB 1ML 25GX5/8 (SYRINGE) ×3 IMPLANT
TOWEL OR 17X24 6PK STRL BLUE (TOWEL DISPOSABLE) ×6 IMPLANT
TUBING AQUILEX INFLOW (TUBING) ×3 IMPLANT
TUBING AQUILEX OUTFLOW (TUBING) ×3 IMPLANT
WATER STERILE IRR 1000ML POUR (IV SOLUTION) ×1 IMPLANT

## 2014-06-22 NOTE — Anesthesia Postprocedure Evaluation (Signed)
Anesthesia Post Note  Patient: Taylor Chaney  Procedure(s) Performed: Procedure(s) (LRB): DILATATION & CURETTAGE/HYSTEROSCOPY WITH VERSAPOINT RESECTION (N/A)  Anesthesia type: General  Patient location: PACU  Post pain: Pain level controlled  Post assessment: Post-op Vital signs reviewed  Last Vitals:  Filed Vitals:   06/22/14 1030  BP: 110/74  Pulse: 81  Temp: 36.8 C  Resp: 16    Post vital signs: Reviewed  Level of consciousness: sedated  Complications: No apparent anesthesia complications

## 2014-06-22 NOTE — Transfer of Care (Signed)
Immediate Anesthesia Transfer of Care Note  Patient: Taylor Chaney  Procedure(s) Performed: Procedure(s): DILATATION & CURETTAGE/HYSTEROSCOPY WITH VERSAPOINT RESECTION (N/A)  Patient Location: PACU  Anesthesia Type:General  Level of Consciousness: awake, alert  and oriented  Airway & Oxygen Therapy: Patient Spontanous Breathing and Patient connected to nasal cannula oxygen  Post-op Assessment: Report given to PACU RN, Post -op Vital signs reviewed and stable and Patient moving all extremities X 4  Post vital signs: Reviewed and stable  Complications: No apparent anesthesia complications

## 2014-06-22 NOTE — Op Note (Signed)
06/22/2014  8:55 AM  PATIENT:  Taylor Chaney  39 y.o. female  PRE-OPERATIVE DIAGNOSIS:  Endometrial Polyp  POST-OPERATIVE DIAGNOSIS:  Endometrial Polyp  PROCEDURE:  Procedure(s): DILATATION & CURETTAGE DIAGNOSTIC HYSTEROSCOPY WITH VERSAPOINT RESECTION  SURGEON:  Surgeon(s): Lovenia Kim, MD  ASSISTANTS: none   ANESTHESIA:   local and general  ESTIMATED BLOOD LOSS: * No blood loss amount entered *   DRAINS: none   LOCAL MEDICATIONS USED:  MARCAINE    and Amount: 20 ml  SPECIMEN:  Source of Specimen:  POLYP AND EMC  DISPOSITION OF SPECIMEN:  PATHOLOGY  COUNTS:  YES  DICTATION #: [id number]  PLAN OF CARE: DC HOME  PATIENT DISPOSITION:  PACU - hemodynamically stable.

## 2014-06-22 NOTE — Discharge Instructions (Signed)
DISCHARGE INSTRUCTIONS: D&C / D&E The following instructions have been prepared to help you care for yourself upon your return home.   Personal hygiene:  Use sanitary pads for vaginal drainage, not tampons.  Shower the day after your procedure.  NO tub baths, pools or Jacuzzis for 2-3 weeks.  Wipe front to back after using the bathroom.  Activity and limitations:  Do NOT drive or operate any equipment for 24 hours. The effects of anesthesia are still present and drowsiness may result.  Do NOT rest in bed all day.  Walking is encouraged.  Walk up and down stairs slowly.  You may resume your normal activity in one to two days or as indicated by your physician.  Sexual activity: NO intercourse for at least 2 weeks after the procedure, or as indicated by your physician.  Diet: Eat a light meal as desired this evening. You may resume your usual diet tomorrow.  Return to work: You may resume your work activities in one to two days or as indicated by your doctor.  What to expect after your surgery: Expect to have vaginal bleeding/discharge for 2-3 days and spotting for up to 10 days. It is not unusual to have soreness for up to 1-2 weeks. You may have a slight burning sensation when you urinate for the first day. Mild cramps may continue for a couple of days. You may have a regular period in 2-6 weeks.  Call your doctor for any of the following:  Excessive vaginal bleeding, saturating and changing one pad every hour.  Inability to urinate 6 hours after discharge from hospital.  Pain not relieved by pain medication.  Fever of 100.4 F or greater.  Unusual vaginal discharge or odor.   Call for an appointment:    Patients signature: ______________________  Nurses signature ________________________  Support person's signature_______________________    Post Anesthesia Home Care Instructions  Activity: Get plenty of rest for the remainder of the day. A responsible  adult should stay with you for 24 hours following the procedure.  For the next 24 hours, DO NOT: -Drive a car -Paediatric nurse -Drink alcoholic beverages -Take any medication unless instructed by your physician -Make any legal decisions or sign important papers.  Meals: Start with liquid foods such as gelatin or soup. Progress to regular foods as tolerated. Avoid greasy, spicy, heavy foods. If nausea and/or vomiting occur, drink only clear liquids until the nausea and/or vomiting subsides. Call your physician if vomiting continues.  Special Instructions/Symptoms: Your throat may feel dry or sore from the anesthesia or the breathing tube placed in your throat during surgery. If this causes discomfort, gargle with warm salt water. The discomfort should disappear within 24 hours.

## 2014-06-22 NOTE — Op Note (Signed)
[name], Taylor Chaney               ACCOUNT NO.:  [id number]  MEDICAL RECORD NO.:  [id number]  LOCATION:  [facility]                          FACILITY:  [facility]  PHYSICIAN:  Lovenia Kim, M.D.DATE OF BIRTH:  [birth year]  DATE OF PROCEDURE:  06/22/2014 DATE OF DISCHARGE:  06/22/2014                              OPERATIVE REPORT   PREOPERATIVE DIAGNOSIS:  Menometrorrhagia with structural lesion by saline sonohysterogram.  POSTOPERATIVE DIAGNOSIS:  Large vascular left lateral wall to fundal endometrial polyp.  PROCEDURE:  Diagnostic hysteroscopy with VersaPoint resection of large endometrial polyp.  Dilation and curettage.  SURGEON:  Lovenia Kim, MD  ASSISTANT:  Taylor Chaney.  ANESTHESIA:  General and local.  ESTIMATED BLOOD LOSS:  About 50 mL.  COMPLICATIONS:  Taylor Chaney.  FLUID DEFICIT:  Estimated to be between 200-250 mL due to malfunction of fluid management machine suctioning was not working.  COUNTS:  Correct.  DISPOSITION:  The patient to recovery in good condition.  BRIEF OPERATIVE NOTE:  After being apprised of the risks of anesthesia, infection, bleeding, injury to surrounding organs, possible need for repair, delayed versus immediate complications to include bowel and bladder injury, possible need for repair, the patient was brought to the operating room where she was administered general anesthetic without complications.  Prepped and draped in usual sterile fashion. Catheterized until the bladder was empty.  Exam under anesthesia revealed a mid positioned uterus, bulky.  No adnexal masses appreciated. Dilute paracervical block placed 20 mL total, dilute Pitressin solution placed at 3 and 9 o'clock at the cervicovaginal junction.  Cervix was grasped using a single-tooth tenaculum and easily dilated up to a 31 Pratt dilator.  Hysteroscope placed.  Visualization revealed a large friable vascular structure consistent with a large endometrial polyp as described in the left  fundal to left lateral wall.  This was resected in multiple passes using the VersaPoint right-angle loop down to the base of the endometrium.  There was also some thickening of the endometrium along the right which was resected as well.  D and C was performed in a 4-quadrant method using sharp curettage. Revisualization revealed complete removal of this vascularized area on the left as well as no evidence of polyp on the right.  Good hemostasis noted.  Fluid deficit calculations as noted.  The patient was awakened and transferred to recovery in good condition.  Tissue was sent to Pathology for permanent section.     Lovenia Kim, M.D.     RJT/MEDQ  D:  06/22/2014  T:  06/22/2014  Job:  [id number]

## 2014-06-22 NOTE — Progress Notes (Signed)
Patient ID: Taylor Chaney, female   DOB: 1975/08/08, 39 y.o.   MRN: [id number] Patient seen and examined. Consent witnessed and signed. No changes noted. Update completed.

## 2014-06-22 NOTE — Anesthesia Preprocedure Evaluation (Signed)
Anesthesia Evaluation  Patient identified by MRN, date of birth, ID band Patient awake    Reviewed: Allergy & Precautions, H&P , NPO status , Patient's Chart, lab work & pertinent test results, reviewed documented beta blocker date and time   Airway Mallampati: I  TM Distance: >3 FB Neck ROM: full    Dental no notable dental hx. (+) Teeth Intact   Pulmonary neg pulmonary ROS,    Pulmonary exam normal       Cardiovascular negative cardio ROS      Neuro/Psych negative neurological ROS  negative psych ROS   GI/Hepatic negative GI ROS, Neg liver ROS,   Endo/Other  negative endocrine ROS  Renal/GU negative Renal ROS     Musculoskeletal   Abdominal Normal abdominal exam  (+)   Peds  Hematology   Anesthesia Other Findings   Reproductive/Obstetrics negative OB ROS                             Anesthesia Physical Anesthesia Plan  ASA: II  Anesthesia Plan: General   Post-op Pain Management:    Induction: Intravenous  Airway Management Planned: LMA  Additional Equipment:   Intra-op Plan:   Post-operative Plan:   Informed Consent: I have reviewed the patients History and Physical, chart, labs and discussed the procedure including the risks, benefits and alternatives for the proposed anesthesia with the patient or authorized representative who has indicated his/her understanding and acceptance.   Dental Advisory Given and History available from chart only  Plan Discussed with: CRNA and Surgeon  Anesthesia Plan Comments:         Anesthesia Quick Evaluation

## 2014-06-23 ENCOUNTER — Encounter (HOSPITAL_COMMUNITY): Payer: Self-pay | Admitting: Obstetrics and Gynecology

## 2015-04-04 ENCOUNTER — Encounter (HOSPITAL_BASED_OUTPATIENT_CLINIC_OR_DEPARTMENT_OTHER): Payer: Self-pay | Admitting: Emergency Medicine

## 2015-04-04 ENCOUNTER — Emergency Department (HOSPITAL_BASED_OUTPATIENT_CLINIC_OR_DEPARTMENT_OTHER): Payer: Managed Care, Other (non HMO)

## 2015-04-04 ENCOUNTER — Emergency Department (HOSPITAL_BASED_OUTPATIENT_CLINIC_OR_DEPARTMENT_OTHER)
Admission: EM | Admit: 2015-04-04 | Discharge: 2015-04-05 | Disposition: A | Discharge status: Home / Self Care | Payer: Managed Care, Other (non HMO) | Attending: Emergency Medicine | Admitting: Emergency Medicine

## 2015-04-04 DIAGNOSIS — N3000 Acute cystitis without hematuria: Secondary | ICD-10-CM | POA: Diagnosis not present

## 2015-04-04 DIAGNOSIS — Z3202 Encounter for pregnancy test, result negative: Secondary | ICD-10-CM | POA: Insufficient documentation

## 2015-04-04 DIAGNOSIS — R109 Unspecified abdominal pain: Secondary | ICD-10-CM

## 2015-04-04 DIAGNOSIS — N200 Calculus of kidney: Secondary | ICD-10-CM | POA: Insufficient documentation

## 2015-04-04 LAB — CBC WITH DIFFERENTIAL/PLATELET
BASOS PCT: 0 %
Basophils Absolute: 0 10*3/uL (ref 0.0–0.1)
Eosinophils Absolute: 0.2 10*3/uL (ref 0.0–0.7)
Eosinophils Relative: 2 %
HEMATOCRIT: 42 % (ref 36.0–46.0)
Hemoglobin: 14.2 g/dL (ref 12.0–15.0)
LYMPHS ABS: 2.9 10*3/uL (ref 0.7–4.0)
Lymphocytes Relative: 29 %
MCH: 29.3 pg (ref 26.0–34.0)
MCHC: 33.8 g/dL (ref 30.0–36.0)
MCV: 86.8 fL (ref 78.0–100.0)
Monocytes Absolute: 0.5 10*3/uL (ref 0.1–1.0)
Monocytes Relative: 5 %
NEUTROS PCT: 64 %
Neutro Abs: 6.5 10*3/uL (ref 1.7–7.7)
Platelets: 381 10*3/uL (ref 150–400)
RBC: 4.84 MIL/uL (ref 3.87–5.11)
RDW: 12.4 % (ref 11.5–15.5)
WBC: 10.1 10*3/uL (ref 4.0–10.5)

## 2015-04-04 LAB — URINE MICROSCOPIC-ADD ON

## 2015-04-04 LAB — COMPREHENSIVE METABOLIC PANEL
ALBUMIN: 4.5 g/dL (ref 3.5–5.0)
ALT: 12 U/L — ABNORMAL LOW (ref 14–54)
ANION GAP: 8 (ref 5–15)
AST: 20 U/L (ref 15–41)
Alkaline Phosphatase: 43 U/L (ref 38–126)
BUN: 14 mg/dL (ref 6–20)
CALCIUM: 9.2 mg/dL (ref 8.9–10.3)
CO2: 26 mmol/L (ref 22–32)
Chloride: 103 mmol/L (ref 101–111)
Creatinine, Ser: 0.58 mg/dL (ref 0.44–1.00)
GFR calc Af Amer: >60 mL/min (ref >60)
GFR calc non Af Amer: >60 mL/min (ref >60)
GLUCOSE: 110 mg/dL — AB (ref 65–99)
Potassium: 4 mmol/L (ref 3.5–5.1)
Sodium: 137 mmol/L (ref 135–145)
TOTAL PROTEIN: 8.7 g/dL — AB (ref 6.5–8.1)
Total Bilirubin: 0.6 mg/dL (ref 0.3–1.2)

## 2015-04-04 LAB — URINALYSIS, ROUTINE W REFLEX MICROSCOPIC
BILIRUBIN URINE: NEGATIVE
Glucose, UA: NEGATIVE mg/dL
KETONES UR: NEGATIVE mg/dL
NITRITE: NEGATIVE
PROTEIN: NEGATIVE mg/dL
Specific Gravity, Urine: 1.024 (ref 1.005–1.030)
UROBILINOGEN UA: 0.2 mg/dL (ref 0.0–1.0)
pH: 6.5 (ref 5.0–8.0)

## 2015-04-04 LAB — PREGNANCY, URINE: PREG TEST UR: NEGATIVE

## 2015-04-04 MED ORDER — ONDANSETRON HCL 4 MG/2ML IJ SOLN
4.0000 mg | Freq: Once | INTRAMUSCULAR | Status: AC
  Administered 2015-04-04: 4 mg via INTRAVENOUS
  Filled 2015-04-04: qty 2

## 2015-04-04 MED ORDER — SODIUM CHLORIDE 0.9 % IV SOLN
1000.0000 mL | Freq: Once | INTRAVENOUS | Status: AC
  Administered 2015-04-05: 1000 mL via INTRAVENOUS

## 2015-04-04 MED ORDER — MORPHINE SULFATE (PF) 4 MG/ML IV SOLN
4.0000 mg | Freq: Once | INTRAVENOUS | Status: AC
  Administered 2015-04-04: 4 mg via INTRAVENOUS
  Filled 2015-04-04: qty 1

## 2015-04-04 MED ORDER — SODIUM CHLORIDE 0.9 % IV SOLN: 1000.0000 mL | INTRAVENOUS | Status: DC

## 2015-04-04 MED ORDER — SODIUM CHLORIDE 0.9 % IV SOLN
1000.0000 mL | Freq: Once | INTRAVENOUS | Status: AC
  Administered 2015-04-04: 1000 mL via INTRAVENOUS

## 2015-04-04 NOTE — ED Notes (Signed)
This RN assumed care of this patient.

## 2015-04-04 NOTE — ED Provider Notes (Signed)
CSN: [id number]     Arrival date & time 04/04/15  2006 History  By signing my name below, I, Stephania Fragmin, attest that this documentation has been prepared under the direction and in the presence of Shawn Joy, PA-C.  Electronically Signed: Stephania Fragmin, ED Scribe. 04/04/2015. 10:40 PM.      Chief Complaint  Patient presents with  . Flank Pain   The history is provided by the patient. No language interpreter was used.   HPI Comments: Taylor Chaney is a 39 y.o. female with a history of kidney stones, who presents to the Emergency Department complaining of RLQ abdominal pain that began 2 days ago and worsened last night. She also complains of right flank pain, nausea, and vomiting multiple episodes last night and one episode today. Patient reports a history of kidney stones and states this feels the same. She states the last time she was here, she had both a UTI and kidney stones. She denies a history of any known kidney conditions. Denies fever/chills, diarrhea, constipation, vaginal discharge or bleeding, or any other pain or complaints.     Past Medical History  Diagnosis Date  . History of kidney stones     passed stones - no surgery required   Past Surgical History  Procedure Laterality Date  . Tonsillectomy    . Dilation and evacuation N/A 04/08/2014    Procedure: DILATATION AND EVACUATION;  Surgeon: Lovenia Kim, MD;  Location: [facility];  Service: Gynecology;  Laterality: N/A;  . Dilitation & currettage/hystroscopy with versapoint resection N/A 06/22/2014    Procedure: DILATATION & CURETTAGE/HYSTEROSCOPY WITH VERSAPOINT RESECTION;  Surgeon: Lovenia Kim, MD;  Location: [facility];  Service: Gynecology;  Laterality: N/A;   History reviewed. No pertinent family history. Social History  Substance Use Topics  . Smoking status: Never Smoker   . Smokeless tobacco: Never Used  . Alcohol Use: No   OB History    Gravida Para Term Preterm AB TAB SAB Ectopic Multiple Living   5 2 2  3  3   2       Review of Systems  Constitutional: Negative for fever.  Gastrointestinal: Positive for nausea, vomiting and abdominal pain.  Genitourinary: Positive for flank pain.  All other systems reviewed and are negative.  Allergies  Review of patient's allergies indicates no known allergies.  Home Medications   Prior to Admission medications   Medication Sig Start Date End Date Taking? Authorizing Provider  ibuprofen (ADVIL,MOTRIN) 200 MG tablet Take 400 mg by mouth every 6 (six) hours as needed for mild pain.    Historical Provider, MD  ketorolac (TORADOL) 10 MG tablet Take 1 tablet (10 mg total) by mouth every 6 (six) hours as needed. 04/05/15   Shawn C Joy, PA-C  nitrofurantoin, macrocrystal-monohydrate, (MACROBID) 100 MG capsule Take 1 capsule (100 mg total) by mouth 2 (two) times daily. 04/05/15   Shawn C Joy, PA-C  ondansetron (ZOFRAN ODT) 4 MG disintegrating tablet Take 1 tablet (4 mg total) by mouth every 8 (eight) hours as needed for nausea or vomiting. 04/05/15   Shawn C Joy, PA-C  traMADol (ULTRAM) 50 MG tablet Take 1-2 tablets (50-100 mg total) by mouth every 6 (six) hours as needed for moderate pain. 06/22/14   Brien Few, MD   BP 129/84 mmHg  Pulse 63  Temp(Src) 98.3 F (36.8 C) (Oral)  Resp 18  Ht 4\' 10"  (1.473 m)  Wt 110 lb (49.896 kg)  BMI 23.00 kg/m2  SpO2 99% Physical  Exam  Constitutional: She is oriented to person, place, and time. She appears well-developed and well-nourished. No distress.  HENT:  Head: Normocephalic and atraumatic.  Eyes: Conjunctivae are normal.  Neck: Neck supple. No tracheal deviation present.  Cardiovascular: Normal rate and regular rhythm.   Pulmonary/Chest: Effort normal and breath sounds normal. No respiratory distress.  Lungs are clear to auscultation.   Abdominal: Soft. Bowel sounds are normal. There is tenderness. There is CVA tenderness.  Right CVA tenderness. Abdomen soft. Tender to RUQ and RLQ.   Musculoskeletal: Normal range  of motion.  Neurological: She is alert and oriented to person, place, and time.  Skin: Skin is warm and dry.  Psychiatric: She has a normal mood and affect. Her behavior is normal.  Nursing note and vitals reviewed.   ED Course  Procedures (including critical care time)  DIAGNOSTIC STUDIES: Oxygen Saturation is 100% on RA, normal by my interpretation.    COORDINATION OF CARE: 10:34 PM - Lab results discussed with pt - suspect UTI but will also perform CT renal study. Pt verbalized understanding and agreed to plan.   Labs Review Labs Reviewed  URINALYSIS, ROUTINE W REFLEX MICROSCOPIC (NOT AT [facility]) - Abnormal; Notable for the following:    APPearance CLOUDY (*)    Hgb urine dipstick LARGE (*)    Leukocytes, UA MODERATE (*)    All other components within normal limits  URINE MICROSCOPIC-ADD ON - Abnormal; Notable for the following:    Squamous Epithelial / LPF FEW (*)    Bacteria, UA MANY (*)    All other components within normal limits  COMPREHENSIVE METABOLIC PANEL - Abnormal; Notable for the following:    Glucose, Bld 110 (*)    Total Protein 8.7 (*)    ALT 12 (*)    All other components within normal limits  PREGNANCY, URINE  CBC WITH DIFFERENTIAL/PLATELET    Imaging Review Ct Renal Stone Study  04/05/2015  CLINICAL DATA:  Initial valuation for acute right lower quadrant abdominal pain, worse in last 90. EXAM: CT ABDOMEN AND PELVIS WITHOUT CONTRAST TECHNIQUE: Multidetector CT imaging of the abdomen and pelvis was performed following the standard protocol without IV contrast. COMPARISON:  Prior study from 06/11/2011. FINDINGS: Visualized lung bases are clear. Liver demonstrates a normal unenhanced appearance. Gallbladder normal. No biliary dilatation. Spleen, adrenal glands, and pancreas are within normal limits. Punctate nonobstructive calculi present within the kidneys bilaterally. These measure up to approximately 2-3 mm. No evidence for hydronephrosis. No radiopaque calculi  seen along the course of either renal collecting system. No hydroureter. Stomach within normal limits. No evidence for bowel obstruction. No abnormal wall thickening or inflammatory fat stranding seen about the bowels. No evidence for acute appendicitis. Bladder within normal limits.  Uterus and ovaries normal. No free air or fluid. No adenopathy. Minimal plaque within the intra-abdominal aorta. No aneurysm. No acute osseous abnormality. No worrisome lytic or blastic osseous lesions. IMPRESSION: 1. Punctate nonobstructive bilateral nephrolithiasis measuring approximately 2-3 mm. No CT evidence for obstructive uropathy. 2. No other acute intra-abdominal or pelvic process. Electronically Signed   By: Jeannine Boga M.D.   On: 04/05/2015 00:32   I have personally reviewed and evaluated these images and lab results as part of my medical decision-making.  MDM   Final diagnoses:  Right flank pain  Nephrolithiasis  Acute cystitis without hematuria   Taylor Chaney presents with right flank pain x3 days.  Suspect renal stone vs, or in addition to, UTI.  Renal stone study,  IV fluids, pain management and zofran ordered.  Plan to evaluate renal stone, if present, and refer to urology.  UA shows evidence of UTI. Pt to receive treatment for UTI outpatient, as well as pain management and anti-nausea medication. CBC and CMP without significant abnormalities. 12:28 AM Pt pain well managed. Awaiting CT read.  12:59 AM Renal stones found bilaterally. No obstruction or stones big enough to warrant urology consult tonight. Pt updated on CT results. Pt agreed to plan of care. Pt confirms she has a ride home.    I personally performed the services described in this documentation, which was scribed in my presence. The recorded information has been reviewed and is accurate.    Lorayne Bender, PA-C 04/05/15 0102  Veryl Speak, MD 04/05/15 [phone]

## 2015-04-04 NOTE — ED Notes (Signed)
Pt in c/o RLQ abdominal pain and R flank pain x 3 days, last time she had these sx it was a kidney stone. NAD.

## 2015-04-05 DIAGNOSIS — N200 Calculus of kidney: Secondary | ICD-10-CM | POA: Diagnosis not present

## 2015-04-05 MED ORDER — KETOROLAC TROMETHAMINE 10 MG PO TABS: 10.0000 mg | ORAL_TABLET | Freq: Four times a day (QID) | ORAL | Status: DC | PRN

## 2015-04-05 MED ORDER — ONDANSETRON 4 MG PO TBDP: 4.0000 mg | ORAL_TABLET | Freq: Three times a day (TID) | ORAL | Status: DC | PRN

## 2015-04-05 MED ORDER — NITROFURANTOIN MONOHYD MACRO 100 MG PO CAPS: 100.0000 mg | ORAL_CAPSULE | Freq: Two times a day (BID) | ORAL | Status: DC

## 2015-04-05 NOTE — ED Notes (Signed)
Contacted by pt, from pharmacy via phone, states, "unable to get Rx filled for toradol d/t pt not having received injection of toradol here before leaving". New order given to pharmacist via phone by Dr. Trish Mage.  Naproxen 500mg  PO BID for pain #30.

## 2015-04-05 NOTE — Discharge Instructions (Signed)
You have been seen today for flank pain. Follow up with PCP as needed. Return to ED should symptoms worsen.

## 2016-12-16 LAB — GLUCOSE, POCT (MANUAL RESULT ENTRY): POC Glucose: 87 mg/dl (ref 70–99)

## 2017-10-14 ENCOUNTER — Emergency Department (HOSPITAL_COMMUNITY): Payer: Managed Care, Other (non HMO)

## 2017-10-14 ENCOUNTER — Other Ambulatory Visit: Payer: Self-pay

## 2017-10-14 ENCOUNTER — Encounter (HOSPITAL_COMMUNITY): Payer: Self-pay | Admitting: Emergency Medicine

## 2017-10-14 ENCOUNTER — Emergency Department (HOSPITAL_COMMUNITY)
Admission: EM | Admit: 2017-10-14 | Discharge: 2017-10-15 | Disposition: A | Discharge status: Home / Self Care | Payer: Managed Care, Other (non HMO) | Attending: Emergency Medicine | Admitting: Emergency Medicine

## 2017-10-14 DIAGNOSIS — R51 Headache: Secondary | ICD-10-CM | POA: Diagnosis not present

## 2017-10-14 DIAGNOSIS — Z79899 Other long term (current) drug therapy: Secondary | ICD-10-CM | POA: Diagnosis not present

## 2017-10-14 DIAGNOSIS — R202 Paresthesia of skin: Secondary | ICD-10-CM | POA: Diagnosis not present

## 2017-10-14 DIAGNOSIS — R519 Headache, unspecified: Secondary | ICD-10-CM

## 2017-10-14 DIAGNOSIS — J324 Chronic pansinusitis: Secondary | ICD-10-CM

## 2017-10-14 LAB — COMPREHENSIVE METABOLIC PANEL
ALT: 11 U/L — ABNORMAL LOW (ref 14–54)
AST: 18 U/L (ref 15–41)
Albumin: 4.1 g/dL (ref 3.5–5.0)
Alkaline Phosphatase: 46 U/L (ref 38–126)
Anion gap: 13 (ref 5–15)
BUN: 11 mg/dL (ref 6–20)
CO2: 21 mmol/L — ABNORMAL LOW (ref 22–32)
Calcium: 9.4 mg/dL (ref 8.9–10.3)
Chloride: 105 mmol/L (ref 101–111)
Creatinine, Ser: 0.74 mg/dL (ref 0.44–1.00)
Glucose, Bld: 106 mg/dL — ABNORMAL HIGH (ref 65–99)
POTASSIUM: 3.6 mmol/L (ref 3.5–5.1)
Sodium: 139 mmol/L (ref 135–145)
Total Bilirubin: 0.4 mg/dL (ref 0.3–1.2)
Total Protein: 8 g/dL (ref 6.5–8.1)

## 2017-10-14 LAB — CBC
HCT: 42.5 % (ref 36.0–46.0)
HEMOGLOBIN: 14.4 g/dL (ref 12.0–15.0)
MCH: 29.4 pg (ref 26.0–34.0)
MCHC: 33.9 g/dL (ref 30.0–36.0)
MCV: 86.7 fL (ref 78.0–100.0)
Platelets: 368 10*3/uL (ref 150–400)
RBC: 4.9 MIL/uL (ref 3.87–5.11)
RDW: 12.4 % (ref 11.5–15.5)
WBC: 12.1 10*3/uL — ABNORMAL HIGH (ref 4.0–10.5)

## 2017-10-14 LAB — DIFFERENTIAL
Basophils Absolute: 0 10*3/uL (ref 0.0–0.1)
Basophils Relative: 0 %
EOS ABS: 0.2 10*3/uL (ref 0.0–0.7)
EOS PCT: 2 %
LYMPHS ABS: 3.4 10*3/uL (ref 0.7–4.0)
Lymphocytes Relative: 28 %
MONO ABS: 0.4 10*3/uL (ref 0.1–1.0)
MONOS PCT: 4 %
Neutro Abs: 8.1 10*3/uL — ABNORMAL HIGH (ref 1.7–7.7)
Neutrophils Relative %: 66 %

## 2017-10-14 LAB — I-STAT CHEM 8, ED
BUN: 12 mg/dL (ref 6–20)
CREATININE: 0.6 mg/dL (ref 0.44–1.00)
Calcium, Ion: 0.97 mmol/L — ABNORMAL LOW (ref 1.15–1.40)
Chloride: 104 mmol/L (ref 101–111)
GLUCOSE: 108 mg/dL — AB (ref 65–99)
HCT: 42 % (ref 36.0–46.0)
HEMOGLOBIN: 14.3 g/dL (ref 12.0–15.0)
Potassium: 3.4 mmol/L — ABNORMAL LOW (ref 3.5–5.1)
Sodium: 140 mmol/L (ref 135–145)
TCO2: 20 mmol/L — AB (ref 22–32)

## 2017-10-14 LAB — PROTIME-INR
INR: 0.89
Prothrombin Time: 11.9 seconds (ref 11.4–15.2)

## 2017-10-14 LAB — ETHANOL

## 2017-10-14 LAB — RAPID URINE DRUG SCREEN, HOSP PERFORMED
Amphetamines: NOT DETECTED
BARBITURATES: NOT DETECTED
Benzodiazepines: NOT DETECTED
COCAINE: NOT DETECTED
Opiates: NOT DETECTED
TETRAHYDROCANNABINOL: NOT DETECTED

## 2017-10-14 LAB — URINALYSIS, ROUTINE W REFLEX MICROSCOPIC
Bilirubin Urine: NEGATIVE
Glucose, UA: NEGATIVE mg/dL
Hgb urine dipstick: NEGATIVE
KETONES UR: 20 mg/dL — AB
LEUKOCYTES UA: NEGATIVE
NITRITE: NEGATIVE
PROTEIN: NEGATIVE mg/dL
Specific Gravity, Urine: 1.039 — ABNORMAL HIGH (ref 1.005–1.030)
pH: 9 — ABNORMAL HIGH (ref 5.0–8.0)

## 2017-10-14 LAB — I-STAT TROPONIN, ED: TROPONIN I, POC: 0 ng/mL (ref 0.00–0.08)

## 2017-10-14 LAB — I-STAT BETA HCG BLOOD, ED (MC, WL, AP ONLY)

## 2017-10-14 LAB — APTT: aPTT: 32 seconds (ref 24–36)

## 2017-10-14 MED ORDER — LORAZEPAM 2 MG/ML IJ SOLN
1.0000 mg | Freq: Once | INTRAMUSCULAR | Status: AC | PRN
  Administered 2017-10-14: 1 mg via INTRAVENOUS
  Filled 2017-10-14: qty 1

## 2017-10-14 MED ORDER — IOPAMIDOL (ISOVUE-370) INJECTION 76%
50.0000 mL | Freq: Once | INTRAVENOUS | Status: AC | PRN
  Administered 2017-10-14: 50 mL via INTRAVENOUS

## 2017-10-14 NOTE — ED Provider Notes (Signed)
Patient placed in Quick Look pathway, seen and evaluated   Chief Complaint: Headache  HPI:   Taylor Chaney is a 42 y.o. female, presenting to the ED with a headache that began suddenly between 5 PM and 5:30 PM this evening.  States she has never had a headache like this.  Headache is in the right parietal region, severe, nonradiating.  Accompanied by nausea and vomiting as well as left-sided upper extremity numbness.  Denies vision deficit, syncope, falls/trauma, chest pain, shortness of breath.  ROS: Headache  Physical Exam:   Gen: Initially appears anxious, but able to be calmed with verbal techniques.  Neuro: Awake and Alert  Skin: Warm    Focused Exam:   No diaphoresis.  No pallor.  HEENT: Airway intact.  Pulmonary: No increased work of breathing.  Speaks in full sentences without difficulty.  Lung sounds clear.  No tachypnea.  Cardiac: Normal rate and regular. Peripheral pulses intact.  Neurologic:   Patient is weak at the shoulders bilaterally, however, she has faster arm drift on the left.  Grip strength weaker on the left.  No noted facial droop.  Strength with extension at the left knee 3/5, 4/5 with extension of the right knee.   Remainder of the cranial nerves III through XII grossly intact.   Patient had sudden onset of severe headache.  Has complaint of numbness in the left upper extremity.  Noted to be unilaterally weaker on the left in the upper and lower extremities. Patient initially appeared anxious, which complicated the exam.  However, once patient was calmed with verbal techniques and showed no physical signs of anxiety, a more accurate exam was able to be performed. Calming the patient took no more than 5 minutes.  The findings noted above were found after patient was calmed and showed no signs of anxiety.    Initiation of care has begun. The patient has been counseled on the process, plan, and necessity for staying for the completion/evaluation, and the  remainder of the medical screening examination   Layla Maw 10/14/17 1934    Daleen Bo, MD 10/15/17 [phone]

## 2017-10-14 NOTE — ED Triage Notes (Signed)
Patient presents to ED for assessment after a sudden onset of headache approx 2 hours ago with gradual left arm tingling, generalized anxiety, and hand cramping (patient hyperventilating at arrival).  PA with patient assessing at this time, patient beginning to calm, better able to assess.

## 2017-10-14 NOTE — Consult Note (Signed)
Requesting Physician: Dr. Eulis Foster    Chief Complaint: Severe headache  History obtained from: Patient and Chart    HPI:                                                                                                                                       Taylor Chaney is an 42 y.o. female with no significant past medical history presents with severe headache that woke her up this evening.  Patient went to take a nap around 3 PM and around 5 5:30 PM noticed severe headache on waking up. She describes the headache as bifrontal throbbing headache and was associated with nausea and vomiting. However, the headache persisted and increased in intensity and therefore her husband brought her to the hospital. On the way, she started hyperventilating and then noticed her left arm became numb and then started to spread to the right arm. At triage she was noted to be weaker on the left side then the right side and a stroke alert was initiated.  On assessment. The patient appears distressed and would not move in all 4 extremities until encouraged.She was crying in the CT scanner  She underwent a head CT showed no acute findings and a CTA head and neck showed  evidence of aneurysm. Shortly after her CT,  her symptoms resolved and she had no neurological deficits.  She states that she does get headaches infrequently, almost 1-2 times a year associated with severe headache that improves after vomiting. However is headaches have never been the same level of intensity.  She is on birth control pills. She is not on any other medications. Denies substance abuse.    Past Medical History:  Diagnosis Date  . History of kidney stones    passed stones - no surgery required    Past Surgical History:  Procedure Laterality Date  . DILATION AND EVACUATION N/A 04/08/2014   Procedure: DILATATION AND EVACUATION;  Surgeon: Lovenia Kim, MD;  Location: [facility];  Service: Gynecology;  Laterality: N/A;  . DILITATION &  CURRETTAGE/HYSTROSCOPY WITH VERSAPOINT RESECTION N/A 06/22/2014   Procedure: DILATATION & CURETTAGE/HYSTEROSCOPY WITH VERSAPOINT RESECTION;  Surgeon: Lovenia Kim, MD;  Location: [facility];  Service: Gynecology;  Laterality: N/A;  . TONSILLECTOMY      History reviewed. No pertinent family history. Social History:  reports that she has never smoked. She has never used smokeless tobacco. She reports that she does not drink alcohol or use drugs.  Allergies: No Known Allergies  Medications:  I reviewed home medications.   ROS:                                                                                                                                     14 systems reviewed and negative except above   Examination:                                                                                                      General: Appears well-developed and well-nourished.  Psych: Affect appropriate to situation Eyes: No scleral injection HENT: No OP obstrucion Head: Normocephalic.  Cardiovascular: Normal rate and regular rhythm.  Respiratory: Effort normal and breath sounds normal to anterior ascultation GI: Soft.  No distension. There is no tenderness.  Skin: WDI   Neurological Examination Mental Status: Alert, oriented, thought content appropriate.  Speech fluent without evidence of aphasia. Able to follow 3 step commands without difficulty. Cranial Nerves: II: Visual fields grossly normal,  III,IV, VI: ptosis not present, extra-ocular motions intact bilaterally, pupils equal, round, reactive to light and accommodation V,VII: smile symmetric, facial light touch sensation normal bilaterally VIII: hearing normal bilaterally IX,X: uvula rises symmetrically XI: bilateral shoulder shrug XII: midline tongue extension Motor: Right : Upper extremity   5/5    Left:      Upper extremity   5/5  Lower extremity   5/5     Lower extremity   5/5 Tone and bulk:normal tone throughout; no atrophy noted Sensory: Pinprick and light touch intact throughout, bilaterally Deep Tendon Reflexes: 2+ and symmetric throughout Plantars: Right: downgoing   Left: downgoing Cerebellar: normal finger-to-nose, normal rapid alternating movements and normal heel-to-shin test Gait: normal gait and station     Lab Results: Basic Metabolic Panel: Recent Labs  Lab 10/14/17 1930 10/14/17 1937  NA 139 140  K 3.6 3.4*  CL 105 104  CO2 21*  --   GLUCOSE 106* 108*  BUN 11 12  CREATININE 0.74 0.60  CALCIUM 9.4  --     CBC: Recent Labs  Lab 10/14/17 1930 10/14/17 1937  WBC 12.1*  --   NEUTROABS 8.1*  --   HGB 14.4 14.3  HCT 42.5 42.0  MCV 86.7  --   PLT 368  --     Coagulation Studies: Recent Labs    10/14/17 1930  LABPROT 11.9  INR 0.89    Imaging: Ct Angio Head W Or Wo Contrast  Result Date: 10/14/2017 CLINICAL DATA:  42 year old female with left side weakness, numbness  and worst headache of life. EXAM: CT ANGIOGRAPHY HEAD AND NECK TECHNIQUE: Multidetector CT imaging of the head and neck was performed using the standard protocol during bolus administration of intravenous contrast. Multiplanar CT image reconstructions and MIPs were obtained to evaluate the vascular anatomy. Carotid stenosis measurements (when applicable) are obtained utilizing NASCET criteria, using the distal internal carotid diameter as the denominator. CONTRAST:  66mL ISOVUE-370 IOPAMIDOL (ISOVUE-370) INJECTION 76% COMPARISON:  Noncontrast head CT 1940 hours today. FINDINGS: CTA NECK Skeleton: Negative. Bilateral paranasal sinus inflammation as stated on the comparison today. Upper chest: Normal lung apices. No superior mediastinal lymphadenopathy. Other neck: Negative.  No neck mass or lymphadenopathy. Aortic arch: 3 vessel arch configuration. Normal arch with no atherosclerosis. Right carotid  system: Normal aside from mild tortuosity. Left carotid system: Normal aside from mild tortuosity. Vertebral arteries: Normal proximal right subclavian artery and right vertebral artery origin. Non dominant right vertebral artery is patent to the skull base without stenosis. Normal proximal left subclavian artery and left vertebral artery origin. Dominant left vertebral artery. Tortuous left V1 segment. The left vertebral is patent to the skull base without stenosis. CTA HEAD Posterior circulation: Dominant distal left vertebral artery, the right is diminutive but remains patent to the vertebrobasilar junction. The right AICA appears dominant. The left PICA origin is normal. Patent, but diminutive basilar artery. Normal SCA origins. Fetal type PCA origins, more so the left. Mildly tortuous left posterior communicating artery. The bilateral PCA branches appear normal. Anterior circulation: Patent ICA siphons. No siphon atherosclerosis or stenosis. Normal posterior communicating artery origins. Normal ophthalmic artery origins. Normal carotid termini, MCA and ACA origins. Mildly tortuous A1 segments. Normal anterior communicating artery. Bilateral ACA branches are within normal limits. Left MCA M1 segment, bifurcation, and left MCA branches are within normal limits. Right MCA M1 segment, bifurcation, and right MCA branches are within normal limits. Venous sinuses: Patent. Incidental prominent bilateral sphenoparietal dural venous sinuses and vessels which abut some of the MCA branches, especially on the left. The bilateral cavernous sinus is enhancing and patent. The right transverse sinus is dominant. Anatomic variants: Dominant left vertebral artery. Fetal type bilateral PCA origins. Dominant right transverse and sphenoid venous sinuses. Review of the MIP images confirms the above findings IMPRESSION: Negative CTA - no large vessel occlusion or intracranial aneurysm. Normal head and neck vascular findings aside from  some arterial tortuosity. Electronically Signed   By: Genevie Ann M.D.   On: 10/14/2017 20:19   Ct Angio Neck W Or Wo Contrast  Result Date: 10/14/2017 CLINICAL DATA:  42 year old female with left side weakness, numbness and worst headache of life. EXAM: CT ANGIOGRAPHY HEAD AND NECK TECHNIQUE: Multidetector CT imaging of the head and neck was performed using the standard protocol during bolus administration of intravenous contrast. Multiplanar CT image reconstructions and MIPs were obtained to evaluate the vascular anatomy. Carotid stenosis measurements (when applicable) are obtained utilizing NASCET criteria, using the distal internal carotid diameter as the denominator. CONTRAST:  1mL ISOVUE-370 IOPAMIDOL (ISOVUE-370) INJECTION 76% COMPARISON:  Noncontrast head CT 1940 hours today. FINDINGS: CTA NECK Skeleton: Negative. Bilateral paranasal sinus inflammation as stated on the comparison today. Upper chest: Normal lung apices. No superior mediastinal lymphadenopathy. Other neck: Negative.  No neck mass or lymphadenopathy. Aortic arch: 3 vessel arch configuration. Normal arch with no atherosclerosis. Right carotid system: Normal aside from mild tortuosity. Left carotid system: Normal aside from mild tortuosity. Vertebral arteries: Normal proximal right subclavian artery and right vertebral artery origin. Non dominant right  vertebral artery is patent to the skull base without stenosis. Normal proximal left subclavian artery and left vertebral artery origin. Dominant left vertebral artery. Tortuous left V1 segment. The left vertebral is patent to the skull base without stenosis. CTA HEAD Posterior circulation: Dominant distal left vertebral artery, the right is diminutive but remains patent to the vertebrobasilar junction. The right AICA appears dominant. The left PICA origin is normal. Patent, but diminutive basilar artery. Normal SCA origins. Fetal type PCA origins, more so the left. Mildly tortuous left posterior  communicating artery. The bilateral PCA branches appear normal. Anterior circulation: Patent ICA siphons. No siphon atherosclerosis or stenosis. Normal posterior communicating artery origins. Normal ophthalmic artery origins. Normal carotid termini, MCA and ACA origins. Mildly tortuous A1 segments. Normal anterior communicating artery. Bilateral ACA branches are within normal limits. Left MCA M1 segment, bifurcation, and left MCA branches are within normal limits. Right MCA M1 segment, bifurcation, and right MCA branches are within normal limits. Venous sinuses: Patent. Incidental prominent bilateral sphenoparietal dural venous sinuses and vessels which abut some of the MCA branches, especially on the left. The bilateral cavernous sinus is enhancing and patent. The right transverse sinus is dominant. Anatomic variants: Dominant left vertebral artery. Fetal type bilateral PCA origins. Dominant right transverse and sphenoid venous sinuses. Review of the MIP images confirms the above findings IMPRESSION: Negative CTA - no large vessel occlusion or intracranial aneurysm. Normal head and neck vascular findings aside from some arterial tortuosity. Electronically Signed   By: Genevie Ann M.D.   On: 10/14/2017 20:19   Mr Brain Wo Contrast  Result Date: 10/14/2017 CLINICAL DATA:  Acute onset headache today at 1700 hours, gradual LEFT arm tingling and hand cramping. EXAM: MRI HEAD WITHOUT CONTRAST TECHNIQUE: Multiplanar, multiecho pulse sequences of the brain and surrounding structures were obtained without intravenous contrast. COMPARISON:  CT angiogram head and neck Oct 14, 2017 FINDINGS: INTRACRANIAL CONTENTS: No reduced diffusion to suggest acute ischemia or hyperacute demyelination. No susceptibility artifact to suggest hemorrhage. The ventricles and sulci are normal for patient's age. No suspicious parenchymal signal, masses, mass effect. No abnormal extra-axial fluid collections. No extra-axial masses. VASCULAR: Normal  major intracranial vascular flow voids present at skull base. SKULL AND UPPER CERVICAL SPINE: No abnormal sellar expansion. No suspicious calvarial bone marrow signal. Craniocervical junction maintained. SINUSES/ORBITS: Pan paranasal sinusitis. Hypoplastic frontal sinuses. Mastoid air cells are well aerated.The included ocular globes and orbital contents are non-suspicious. OTHER: None. IMPRESSION: 1. Normal noncontrast MRI head. 2. Pan paranasal sinusitis. Electronically Signed   By: Elon Alas M.D.   On: 10/14/2017 23:19   Ct Head Code Stroke Wo Contrast  Result Date: 10/14/2017 CLINICAL DATA:  Code stroke. 42 year old female with left side weakness. Worst headache of life. EXAM: CT HEAD WITHOUT CONTRAST TECHNIQUE: Contiguous axial images were obtained from the base of the skull through the vertex without intravenous contrast. COMPARISON:  None. FINDINGS: Brain: No midline shift, ventriculomegaly, mass effect, evidence of mass lesion, intracranial hemorrhage or evidence of cortically based acute infarction. Gray-white matter differentiation is within normal limits throughout the brain. Normal cerebral volume. Vascular: No suspicious intracranial vascular hyperdensity. Skull: Negative. Sinuses/Orbits: Bilateral maxillary sinus fluid levels with widespread sinus mucosal thickening and bubbly opacity. There is symmetric appearing mucosal thickening in the visible nasal cavity with some retained secretions. Small volume retained secretions in the nasopharynx. The bilateral tympanic cavities and mastoids are clear. Other: Visualized orbits and scalp soft tissues are within normal limits. ASPECTS [facility] Stroke Program Early CT  Score) - Ganglionic level infarction (caudate, lentiform nuclei, internal capsule, insula, M1-M3 cortex): 7 - Supraganglionic infarction (M4-M6 cortex): 3 Total score (0-10 with 10 being normal): 10 IMPRESSION: 1. Normal noncontrast CT appearance of the brain. 2. ASPECTS is 10. 3.  Acute Rhinosinusitis suspected with bilateral paranasal sinus inflammation. 4. These results were communicated to Dr. Lorraine Lax at 7:46 pmon 5/12/2019by text page via the [facility] messaging system. Electronically Signed   By: Genevie Ann M.D.   On: 10/14/2017 19:46       ASSESSMENT AND PLAN  42 year old female presents to the  ER after waking up with worse headache of her life. Associated with nausea or vomiting. Patient also becomes anxious/hyperventilates noticed numbness over left side spreading to right side.  Had left side weakness at triage, appeared more like generalized weakness effort dependent on my assessment in the setting of anxiety.  Thunderclap headache Possible Migraine   CT Head neg for hemorrhage CTA head and neck negative for aneurysm/RCVS MRI brain normal, shows pansinusitis Given negative CT, CTA and MR brain low suspicion for [facility].   Recommendations  Follow up with Dr Jaynee Eagles Dicuss alternate agents to [facility] for birth control    Sushanth Aroor [facility] Pager Number [phone]

## 2017-10-14 NOTE — ED Provider Notes (Signed)
[place] EMERGENCY DEPARTMENT Provider Note   CSN: [id number] Arrival date & time: 10/14/17  1906     History   Chief Complaint Chief Complaint  Patient presents with  . Code Stroke    HPI Taylor Chaney is a 42 y.o. female.  HPI\  Patient presented to ED today for evaluation of headache which started shortly before arrival.  She was seen at triage, code stroke called and she was evaluated immediately by neurology with comprehensive evaluation begun.  Patient seen by me at 9:20 PM.  At this time, she denies headache.  She reports intermittent headaches in the past, sometimes associated with vomiting which improves her headache.  No prior diagnosis or treatment for migraine disorder.  No comprehensive evaluation in the past, for headaches.  She denies recent fever, chills, nausea, vomiting, cough, shortness of breath, head trauma, difficulty eating or walking.  There are no other known modifying factors.  Past Medical History:  Diagnosis Date  . History of kidney stones    passed stones - no surgery required    Patient Active Problem List   Diagnosis Date Noted  . Postpartum care following VAVD (9/3) 02/06/2013  . Acute blood loss anemia 02/06/2013    Past Surgical History:  Procedure Laterality Date  . DILATION AND EVACUATION N/A 04/08/2014   Procedure: DILATATION AND EVACUATION;  Surgeon: Lovenia Kim, MD;  Location: [facility];  Service: Gynecology;  Laterality: N/A;  . DILITATION & CURRETTAGE/HYSTROSCOPY WITH VERSAPOINT RESECTION N/A 06/22/2014   Procedure: DILATATION & CURETTAGE/HYSTEROSCOPY WITH VERSAPOINT RESECTION;  Surgeon: Lovenia Kim, MD;  Location: [facility];  Service: Gynecology;  Laterality: N/A;  . TONSILLECTOMY       OB History    Gravida  5   Para  2   Term  2   Preterm      AB  3   Living  2     SAB  3   TAB      Ectopic      Multiple      Live Births  2            Home Medications    Prior to Admission  medications   Medication Sig Start Date End Date Taking? Authorizing Provider  ibuprofen (ADVIL,MOTRIN) 200 MG tablet Take 400 mg by mouth every 6 (six) hours as needed for mild pain.   Yes [provider]  JUNEL FE 1/20 1-20 MG-MCG tablet Take 1 tablet by mouth daily. 07/21/17  Yes [provider]  ketorolac (TORADOL) 10 MG tablet Take 1 tablet (10 mg total) by mouth every 6 (six) hours as needed. Patient not taking: Reported on 10/14/2017 04/05/15   Joy, Helane Gunther, PA-C  nitrofurantoin, macrocrystal-monohydrate, (MACROBID) 100 MG capsule Take 1 capsule (100 mg total) by mouth 2 (two) times daily. Patient not taking: Reported on 10/14/2017 04/05/15   Joy, Shawn C, PA-C  ondansetron (ZOFRAN ODT) 4 MG disintegrating tablet Take 1 tablet (4 mg total) by mouth every 8 (eight) hours as needed for nausea or vomiting. Patient not taking: Reported on 10/14/2017 04/05/15   Joy, Raquel Sarna C, PA-C  traMADol (ULTRAM) 50 MG tablet Take 1-2 tablets (50-100 mg total) by mouth every 6 (six) hours as needed for moderate pain. Patient not taking: Reported on 10/14/2017 06/22/14   Brien Few, MD    Family History History reviewed. No pertinent family history.  Social History Social History   Tobacco Use  . Smoking status: Never Smoker  .  Smokeless tobacco: Never Used  Substance Use Topics  . Alcohol use: No  . Drug use: No     Allergies   Patient has no known allergies.   Review of Systems Review of Systems  All other systems reviewed and are negative.    Physical Exam Updated Vital Signs BP 113/65   Pulse 65   Temp 98 F (36.7 C) (Oral)   Resp 18   Wt 50.2 kg (110 lb 10.7 oz)   SpO2 97%   BMI 23.13 kg/m   Physical Exam  Constitutional: She is oriented to person, place, and time. She appears well-developed and well-nourished.  HENT:  Head: Normocephalic and atraumatic.  Eyes: Pupils are equal, round, and reactive to light. Conjunctivae and EOM are normal.  Neck:  Normal range of motion and phonation normal. Neck supple.  Cardiovascular: Normal rate and regular rhythm.  Pulmonary/Chest: Effort normal and breath sounds normal. She exhibits no tenderness.  Abdominal: Soft. She exhibits no distension. There is no tenderness. There is no guarding.  Musculoskeletal: Normal range of motion.  Neurological: She is alert and oriented to person, place, and time. She exhibits normal muscle tone.  No dysarthria, aphasia or nystagmus.  No ataxia.  No pronator drift.  Skin: Skin is warm and dry.  Psychiatric: She has a normal mood and affect. Her behavior is normal. Judgment and thought content normal.  Nursing note and vitals reviewed.    ED Treatments / Results  Labs (all labs ordered are listed, but only abnormal results are displayed) Labs Reviewed  CBC - Abnormal; Notable for the following components:      Result Value   WBC 12.1 (*)    All other components within normal limits  DIFFERENTIAL - Abnormal; Notable for the following components:   Neutro Abs 8.1 (*)    All other components within normal limits  COMPREHENSIVE METABOLIC PANEL - Abnormal; Notable for the following components:   CO2 21 (*)    Glucose, Bld 106 (*)    ALT 11 (*)    All other components within normal limits  URINALYSIS, ROUTINE W REFLEX MICROSCOPIC - Abnormal; Notable for the following components:   Specific Gravity, Urine 1.039 (*)    pH 9.0 (*)    Ketones, ur 20 (*)    All other components within normal limits  I-STAT CHEM 8, ED - Abnormal; Notable for the following components:   Potassium 3.4 (*)    Glucose, Bld 108 (*)    Calcium, Ion 0.97 (*)    TCO2 20 (*)    All other components within normal limits  ETHANOL  PROTIME-INR  APTT  RAPID URINE DRUG SCREEN, HOSP PERFORMED  I-STAT TROPONIN, ED  I-STAT BETA HCG BLOOD, ED ([facility], [facility], [facility] ONLY)    EKG None  Radiology Ct Angio Head W Or Wo Contrast  Result Date: 10/14/2017 CLINICAL DATA:  42 year old female with left  side weakness, numbness and worst headache of life. EXAM: CT ANGIOGRAPHY HEAD AND NECK TECHNIQUE: Multidetector CT imaging of the head and neck was performed using the standard protocol during bolus administration of intravenous contrast. Multiplanar CT image reconstructions and MIPs were obtained to evaluate the vascular anatomy. Carotid stenosis measurements (when applicable) are obtained utilizing NASCET criteria, using the distal internal carotid diameter as the denominator. CONTRAST:  58mL ISOVUE-370 IOPAMIDOL (ISOVUE-370) INJECTION 76% COMPARISON:  Noncontrast head CT 1940 hours today. FINDINGS: CTA NECK Skeleton: Negative. Bilateral paranasal sinus inflammation as stated on the comparison today. Upper chest: Normal  lung apices. No superior mediastinal lymphadenopathy. Other neck: Negative.  No neck mass or lymphadenopathy. Aortic arch: 3 vessel arch configuration. Normal arch with no atherosclerosis. Right carotid system: Normal aside from mild tortuosity. Left carotid system: Normal aside from mild tortuosity. Vertebral arteries: Normal proximal right subclavian artery and right vertebral artery origin. Non dominant right vertebral artery is patent to the skull base without stenosis. Normal proximal left subclavian artery and left vertebral artery origin. Dominant left vertebral artery. Tortuous left V1 segment. The left vertebral is patent to the skull base without stenosis. CTA HEAD Posterior circulation: Dominant distal left vertebral artery, the right is diminutive but remains patent to the vertebrobasilar junction. The right AICA appears dominant. The left PICA origin is normal. Patent, but diminutive basilar artery. Normal SCA origins. Fetal type PCA origins, more so the left. Mildly tortuous left posterior communicating artery. The bilateral PCA branches appear normal. Anterior circulation: Patent ICA siphons. No siphon atherosclerosis or stenosis. Normal posterior communicating artery origins. Normal  ophthalmic artery origins. Normal carotid termini, MCA and ACA origins. Mildly tortuous A1 segments. Normal anterior communicating artery. Bilateral ACA branches are within normal limits. Left MCA M1 segment, bifurcation, and left MCA branches are within normal limits. Right MCA M1 segment, bifurcation, and right MCA branches are within normal limits. Venous sinuses: Patent. Incidental prominent bilateral sphenoparietal dural venous sinuses and vessels which abut some of the MCA branches, especially on the left. The bilateral cavernous sinus is enhancing and patent. The right transverse sinus is dominant. Anatomic variants: Dominant left vertebral artery. Fetal type bilateral PCA origins. Dominant right transverse and sphenoid venous sinuses. Review of the MIP images confirms the above findings IMPRESSION: Negative CTA - no large vessel occlusion or intracranial aneurysm. Normal head and neck vascular findings aside from some arterial tortuosity. Electronically Signed   By: Genevie Ann M.D.   On: 10/14/2017 20:19   Ct Angio Neck W Or Wo Contrast  Result Date: 10/14/2017 CLINICAL DATA:  42 year old female with left side weakness, numbness and worst headache of life. EXAM: CT ANGIOGRAPHY HEAD AND NECK TECHNIQUE: Multidetector CT imaging of the head and neck was performed using the standard protocol during bolus administration of intravenous contrast. Multiplanar CT image reconstructions and MIPs were obtained to evaluate the vascular anatomy. Carotid stenosis measurements (when applicable) are obtained utilizing NASCET criteria, using the distal internal carotid diameter as the denominator. CONTRAST:  46mL ISOVUE-370 IOPAMIDOL (ISOVUE-370) INJECTION 76% COMPARISON:  Noncontrast head CT 1940 hours today. FINDINGS: CTA NECK Skeleton: Negative. Bilateral paranasal sinus inflammation as stated on the comparison today. Upper chest: Normal lung apices. No superior mediastinal lymphadenopathy. Other neck: Negative.  No neck  mass or lymphadenopathy. Aortic arch: 3 vessel arch configuration. Normal arch with no atherosclerosis. Right carotid system: Normal aside from mild tortuosity. Left carotid system: Normal aside from mild tortuosity. Vertebral arteries: Normal proximal right subclavian artery and right vertebral artery origin. Non dominant right vertebral artery is patent to the skull base without stenosis. Normal proximal left subclavian artery and left vertebral artery origin. Dominant left vertebral artery. Tortuous left V1 segment. The left vertebral is patent to the skull base without stenosis. CTA HEAD Posterior circulation: Dominant distal left vertebral artery, the right is diminutive but remains patent to the vertebrobasilar junction. The right AICA appears dominant. The left PICA origin is normal. Patent, but diminutive basilar artery. Normal SCA origins. Fetal type PCA origins, more so the left. Mildly tortuous left posterior communicating artery. The bilateral PCA branches appear normal.  Anterior circulation: Patent ICA siphons. No siphon atherosclerosis or stenosis. Normal posterior communicating artery origins. Normal ophthalmic artery origins. Normal carotid termini, MCA and ACA origins. Mildly tortuous A1 segments. Normal anterior communicating artery. Bilateral ACA branches are within normal limits. Left MCA M1 segment, bifurcation, and left MCA branches are within normal limits. Right MCA M1 segment, bifurcation, and right MCA branches are within normal limits. Venous sinuses: Patent. Incidental prominent bilateral sphenoparietal dural venous sinuses and vessels which abut some of the MCA branches, especially on the left. The bilateral cavernous sinus is enhancing and patent. The right transverse sinus is dominant. Anatomic variants: Dominant left vertebral artery. Fetal type bilateral PCA origins. Dominant right transverse and sphenoid venous sinuses. Review of the MIP images confirms the above findings  IMPRESSION: Negative CTA - no large vessel occlusion or intracranial aneurysm. Normal head and neck vascular findings aside from some arterial tortuosity. Electronically Signed   By: Genevie Ann M.D.   On: 10/14/2017 20:19   Mr Brain Wo Contrast  Result Date: 10/14/2017 CLINICAL DATA:  Acute onset headache today at 1700 hours, gradual LEFT arm tingling and hand cramping. EXAM: MRI HEAD WITHOUT CONTRAST TECHNIQUE: Multiplanar, multiecho pulse sequences of the brain and surrounding structures were obtained without intravenous contrast. COMPARISON:  CT angiogram head and neck Oct 14, 2017 FINDINGS: INTRACRANIAL CONTENTS: No reduced diffusion to suggest acute ischemia or hyperacute demyelination. No susceptibility artifact to suggest hemorrhage. The ventricles and sulci are normal for patient's age. No suspicious parenchymal signal, masses, mass effect. No abnormal extra-axial fluid collections. No extra-axial masses. VASCULAR: Normal major intracranial vascular flow voids present at skull base. SKULL AND UPPER CERVICAL SPINE: No abnormal sellar expansion. No suspicious calvarial bone marrow signal. Craniocervical junction maintained. SINUSES/ORBITS: Pan paranasal sinusitis. Hypoplastic frontal sinuses. Mastoid air cells are well aerated.The included ocular globes and orbital contents are non-suspicious. OTHER: None. IMPRESSION: 1. Normal noncontrast MRI head. 2. Pan paranasal sinusitis. Electronically Signed   By: Elon Alas M.D.   On: 10/14/2017 23:19   Ct Head Code Stroke Wo Contrast  Result Date: 10/14/2017 CLINICAL DATA:  Code stroke. 42 year old female with left side weakness. Worst headache of life. EXAM: CT HEAD WITHOUT CONTRAST TECHNIQUE: Contiguous axial images were obtained from the base of the skull through the vertex without intravenous contrast. COMPARISON:  None. FINDINGS: Brain: No midline shift, ventriculomegaly, mass effect, evidence of mass lesion, intracranial hemorrhage or evidence of  cortically based acute infarction. Gray-white matter differentiation is within normal limits throughout the brain. Normal cerebral volume. Vascular: No suspicious intracranial vascular hyperdensity. Skull: Negative. Sinuses/Orbits: Bilateral maxillary sinus fluid levels with widespread sinus mucosal thickening and bubbly opacity. There is symmetric appearing mucosal thickening in the visible nasal cavity with some retained secretions. Small volume retained secretions in the nasopharynx. The bilateral tympanic cavities and mastoids are clear. Other: Visualized orbits and scalp soft tissues are within normal limits. ASPECTS [facility] Stroke Program Early CT Score) - Ganglionic level infarction (caudate, lentiform nuclei, internal capsule, insula, M1-M3 cortex): 7 - Supraganglionic infarction (M4-M6 cortex): 3 Total score (0-10 with 10 being normal): 10 IMPRESSION: 1. Normal noncontrast CT appearance of the brain. 2. ASPECTS is 10. 3. Acute Rhinosinusitis suspected with bilateral paranasal sinus inflammation. 4. These results were communicated to Dr. Lorraine Lax at 7:46 pmon 5/12/2019by text page via the [facility] messaging system. Electronically Signed   By: Genevie Ann M.D.   On: 10/14/2017 19:46    Procedures .Critical Care Performed by: Daleen Bo, MD Authorized by: Daleen Bo,  MD   Critical care provider statement:    Critical care time (minutes):  40   Critical care start time:  10/14/2017 9:21 PM   Critical care end time:  10/15/2017 12:06 AM   Critical care time was exclusive of:  Separately billable procedures and treating other patients   Critical care was necessary to treat or prevent imminent or life-threatening deterioration of the following conditions:  CNS failure or compromise   Critical care was time spent personally by me on the following activities:  Blood draw for specimens, development of treatment plan with patient or surrogate, discussions with consultants, evaluation of patient's response  to treatment, examination of patient, obtaining history from patient or surrogate, ordering and performing treatments and interventions, ordering and review of laboratory studies, pulse oximetry, re-evaluation of patient's condition, review of old charts and ordering and review of radiographic studies   (including critical care time)  Medications Ordered in ED Medications  acetaminophen (TYLENOL) tablet 650 mg (has no administration in time range)  ondansetron (ZOFRAN-ODT) disintegrating tablet 8 mg (has no administration in time range)  iopamidol (ISOVUE-370) 76 % injection 50 mL (50 mLs Intravenous Contrast Given 10/14/17 1955)  LORazepam (ATIVAN) injection 1 mg (1 mg Intravenous Given 10/14/17 2210)     Initial Impression / Assessment and Plan / ED Course  I have reviewed the triage vital signs and the nursing notes.  Pertinent labs & imaging results that were available during my care of the patient were reviewed by me and considered in my medical decision making (see chart for details).  Clinical Course as of Oct 15 4  Sun Oct 14, 2017  2033 Spoke with Dr. Lorraine Lax. States we can definitively rule out hemorrhage with MRI.  Patient will need 1 mg Ativan prior to MRI.   If MRI is normal, patient may be discharged.  Follow-up with Dr. Jaynee Eagles, neurologist and headache specialist.  This information was communicated with Dr. Eulis Foster, [place] attending for the room in which patient was assigned.    [SJ]  2130 Case discussed with Dr. Lorraine Lax, he continues to assert that the patient requires MRI, as second modality to rule out intracranial bleeding from a aneurysm.  Note CT angios was negative as was initial head CT.  Patient reluctant to proceed with MRI.   [EW]  2220 MRI here to get patient and she now agrees to have the MRI.   [EW]  2354 No CVA.  Pansinusitis is present, acute.  Images reviewed  CT HEAD CODE STROKE WO CONTRAST [EW]  2355 Normal  Urine rapid drug screen (hosp performed) [EW]  2356  Normal except potassium low and glucose high.  Also calcium low  I-Stat Chem 8, ED(!) [EW]  2357 Normal except specific gravity high indicating dehydration  Urinalysis, Routine w reflex microscopic(!) [EW]    Clinical Course User Index [EW] Daleen Bo, MD [SJ] Lorayne Bender, PA-C     Patient Vitals for the past 24 hrs:  BP Temp Temp src Pulse Resp SpO2 Weight  10/14/17 2330 113/65 - - 65 18 97 % -  10/14/17 2315 135/77 - - 66 20 98 % -  10/14/17 2115 140/86 - - 70 20 99 % -  10/14/17 2100 (!) 144/85 - - 72 (!) 21 98 % -  10/14/17 2045 (!) 146/86 - - 68 12 99 % -  10/14/17 2004 - 98 F (36.7 C) Oral - - - -  10/14/17 2003 (!) 151/88 - - 71 12 97 % -  10/14/17 1915 118/86 - - 76 (!) 22 100 % 50.2 kg (110 lb 10.7 oz)    11:56 PM Reevaluation with update and discussion. After initial assessment and treatment, an updated evaluation reveals since the MRI she has developed a mild headache, and vomited once.  Findings discussed with the patient and all questions answered.  She relates that she has had a "cold" recently.  She describes having sinus congestion.  She denies fever, chills, purulent nasal discharge. Daleen Bo   Medical Decision Making: Headache, with evidence for acute sinusitis.  Comprehensive evaluation, following code stroke presentation including advanced imaging with dual modality rule out for subarachnoid hemorrhage.  No indication for hospitalization or further intervention at this time.  Sinus disease, differential diagnosis includes allergic syndrome and viral process.  Doubt bacterial sinusitis.  Doubt serious bacterial infection or metabolic instability.  CRITICAL CARE-yes Performed by: Daleen Bo    Final Clinical Impressions(s) / ED Diagnoses   Final diagnoses:  None    ED Discharge Orders    None       Daleen Bo, MD 10/15/17 [phone]

## 2017-10-14 NOTE — ED Notes (Signed)
Patient transported to MRI 

## 2017-10-14 NOTE — ED Notes (Signed)
Code stroke activated @ 1933.Marland KitchenMarland KitchenD30

## 2017-10-15 DIAGNOSIS — R51 Headache: Secondary | ICD-10-CM | POA: Diagnosis not present

## 2017-10-15 MED ORDER — ONDANSETRON HCL 8 MG PO TABS: 8.0000 mg | ORAL_TABLET | Freq: Three times a day (TID) | ORAL | 0 refills | Status: DC | PRN

## 2017-10-15 MED ORDER — ACETAMINOPHEN 325 MG PO TABS
650.0000 mg | ORAL_TABLET | Freq: Once | ORAL | Status: AC
  Administered 2017-10-15: 650 mg via ORAL
  Filled 2017-10-15: qty 2

## 2017-10-15 MED ORDER — ONDANSETRON 4 MG PO TBDP
8.0000 mg | ORAL_TABLET | Freq: Once | ORAL | Status: AC
Start: 2017-10-15 — End: 2017-10-15
  Administered 2017-10-15: 8 mg via ORAL
  Filled 2017-10-15: qty 2

## 2017-10-15 NOTE — Discharge Instructions (Addendum)
There were no serious causes found for the headache including internal bleeding in the brain.  The images did indicate that you have sinusitis in multiple areas.  To treat sinus infection try using Flonase nasal spray 1 in each nostril twice a day for 2 to 3 weeks.  Also to help sinus pain and congestion try using Afrin nasal spray, twice a day for 1 week.  We are giving a prescription for nausea medicine to use if needed, and advised using Tylenol or Motrin for pain.  Follow-up with your primary care doctor if you are not better in 1 week and as needed.

## 2018-09-17 IMAGING — CT CT ANGIO NECK
2 of 7 series · 8 of 33 positions shown · IV contrast (OMNI 350)
Comparison: Noncontrast head CT 8210 hours today.

CLINICAL DATA: 41-year-old female with left side weakness, numbness
and worst headache of life.

EXAM:
CT ANGIOGRAPHY HEAD AND NECK
TECHNIQUE: Multidetector CT imaging of the head and neck was performed using
the standard protocol during bolus administration of intravenous
contrast. Multiplanar CT image reconstructions and MIPs were
obtained to evaluate the vascular anatomy. Carotid stenosis
measurements (when applicable) are obtained utilizing NASCET
criteria, using the distal internal carotid diameter as the
denominator.
CONTRAST:  50mL 7CPI1U-4US IOPAMIDOL (7CPI1U-4US) INJECTION 76%

[Series 5: cta neck · axial · 0.55mm/px · z∈[-209,-97]mm · 2 of 170 slices shown]
[im 57/170  soft-tissue]
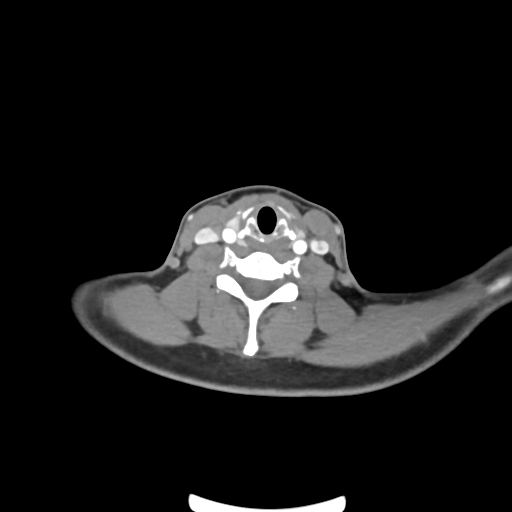
[im 113/170  soft-tissue]
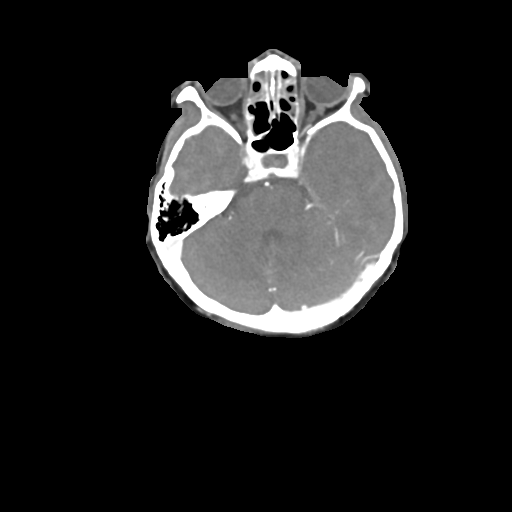

[Series 7: cta neck axial · axial · 0.39mm/px · z∈[-274,-32]mm · 6 of 340 slices shown]
[im 49/340  soft-tissue]
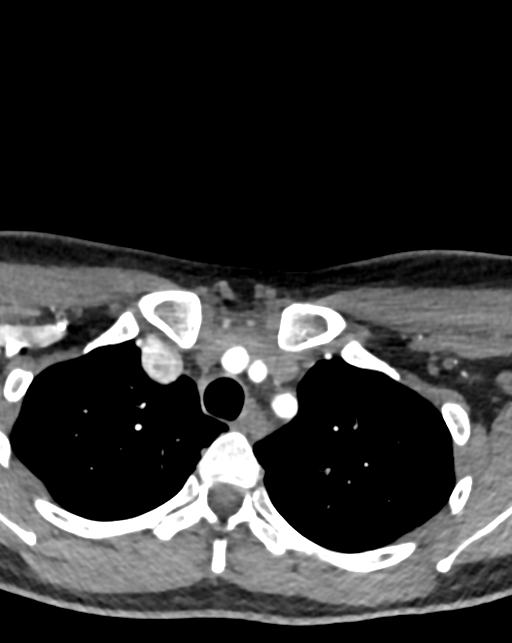
[im 97/340  bone]
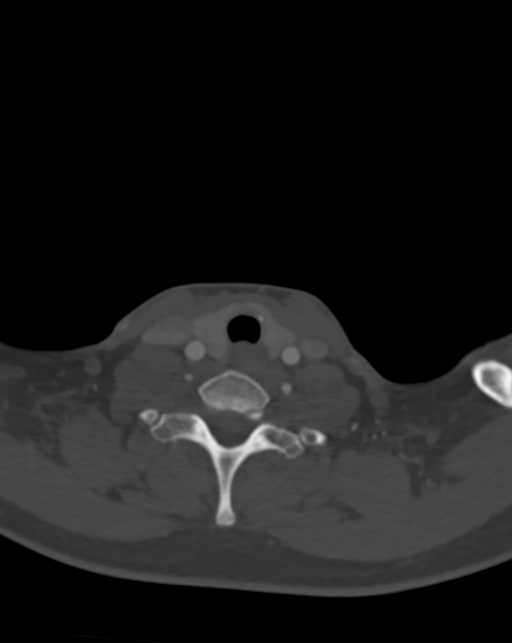
[im 146/340  soft-tissue]
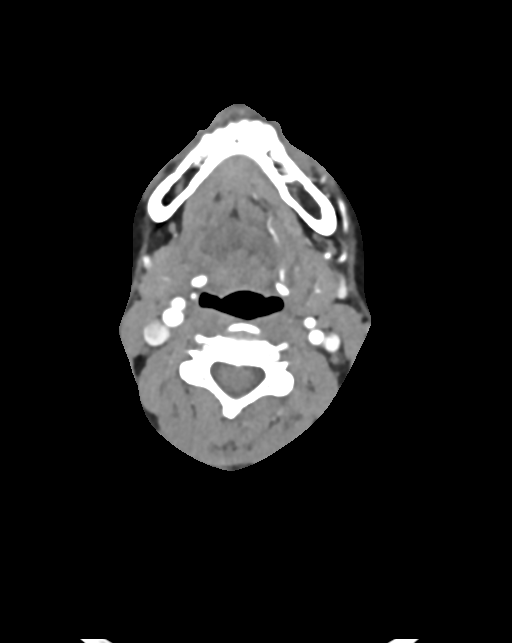
[im 194/340  bone]
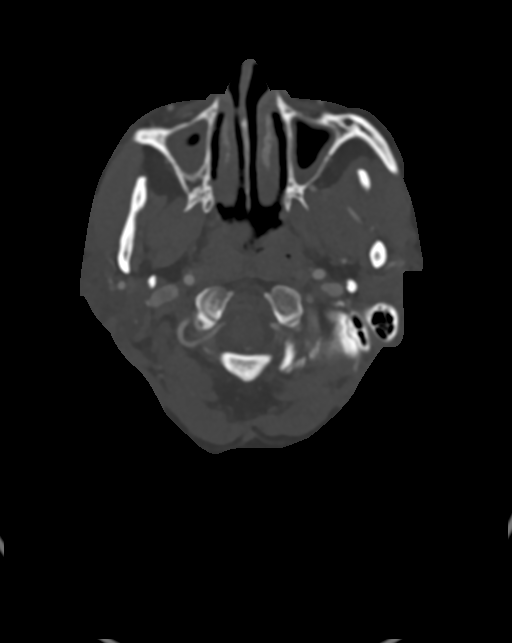
[im 243/340  soft-tissue]
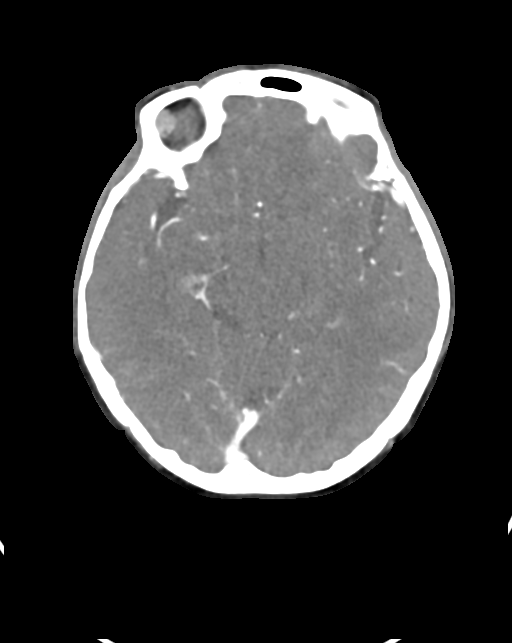
[im 291/340  bone]
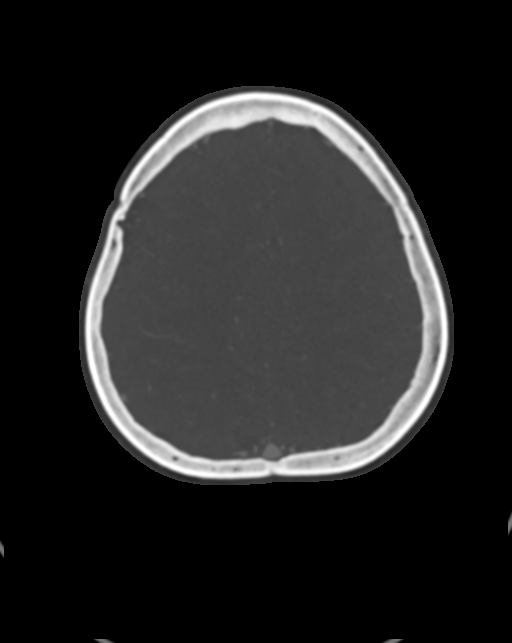

[8 of 33 positions shown; findings below may reference images not displayed]

FINDINGS: CTA NECK

Skeleton: Negative. Bilateral paranasal sinus inflammation as stated
on the comparison today.

Upper chest: Normal lung apices. No superior mediastinal
lymphadenopathy.

Other neck: Negative.  No neck mass or lymphadenopathy.

Aortic arch: 3 vessel arch configuration. Normal arch with no
atherosclerosis.

Right carotid system: Normal aside from mild tortuosity.

Left carotid system: Normal aside from mild tortuosity.

Vertebral arteries:
Normal proximal right subclavian artery and right vertebral artery
origin. Non dominant right vertebral artery is patent to the skull
base without stenosis.

Normal proximal left subclavian artery and left vertebral artery
origin. Dominant left vertebral artery. Tortuous left V1 segment.
The left vertebral is patent to the skull base without stenosis.

CTA HEAD

Posterior circulation: Dominant distal left vertebral artery, the
right is diminutive but remains patent to the vertebrobasilar
junction. The right AICA appears dominant. The left PICA origin is
normal. Patent, but diminutive basilar artery. Normal SCA origins.
Fetal type PCA origins, more so the left. Mildly tortuous left
posterior communicating artery. The bilateral PCA branches appear
normal.

Anterior circulation: Patent ICA siphons. No siphon atherosclerosis
or stenosis. Normal posterior communicating artery origins. Normal
ophthalmic artery origins. Normal carotid termini, MCA and ACA
origins. Mildly tortuous A1 segments. Normal anterior communicating
artery. Bilateral ACA branches are within normal limits. Left MCA M1
segment, bifurcation, and left MCA branches are within normal
limits. Right MCA M1 segment, bifurcation, and right MCA branches
are within normal limits.

Venous sinuses: Patent. Incidental prominent bilateral
sphenoparietal dural venous sinuses and vessels which abut some of
the MCA branches, especially on the left. The bilateral cavernous
sinus is enhancing and patent. The right transverse sinus is
dominant.

Anatomic variants: Dominant left vertebral artery. Fetal type
bilateral PCA origins. Dominant right transverse and sphenoid venous
sinuses.

Review of the MIP images confirms the above findings
IMPRESSION: Negative CTA - no large vessel occlusion or intracranial aneurysm.

Normal head and neck vascular findings aside from some arterial
tortuosity.

## 2020-07-12 ENCOUNTER — Other Ambulatory Visit: Payer: Self-pay | Admitting: Obstetrics and Gynecology

## 2020-07-19 ENCOUNTER — Other Ambulatory Visit: Payer: Self-pay

## 2020-07-19 ENCOUNTER — Encounter (HOSPITAL_BASED_OUTPATIENT_CLINIC_OR_DEPARTMENT_OTHER): Payer: Self-pay | Admitting: Obstetrics and Gynecology

## 2020-07-19 NOTE — Progress Notes (Signed)
Spoke w/ via phone for pre-op interview--- PT Lab needs dos--- CBC, Serum preg., T&S        Lab results------  no COVID test ------ pt have rapid covid dos due to she lives in [place] and not getting until afternoon.  Norris Cross RN, AD @[facility]  aware.  Pt aware to call front desk when she arrives in from ot building and after test done wait in car until receive call from [facility] test is negative then can check in Arrive at ------- 0615 on 07-22-2020 NPO after MN Medications to take morning of surgery ----- NONE Diabetic medication ----- n/a Patient Special Instructions ----- n/a Pre-Op special Istructions ----- n/a Patient verbalized understanding of instructions that were given at this phone interview. Patient denies shortness of breath, chest pain, fever, cough at this phone interview.

## 2020-07-22 ENCOUNTER — Ambulatory Visit (HOSPITAL_BASED_OUTPATIENT_CLINIC_OR_DEPARTMENT_OTHER): Payer: Managed Care, Other (non HMO) | Admitting: Anesthesiology

## 2020-07-22 ENCOUNTER — Encounter (HOSPITAL_BASED_OUTPATIENT_CLINIC_OR_DEPARTMENT_OTHER)
Admission: RE | Disposition: A | Discharge status: Home / Self Care | Payer: Self-pay | Source: Home / Self Care | Attending: Obstetrics and Gynecology

## 2020-07-22 ENCOUNTER — Other Ambulatory Visit: Payer: Self-pay

## 2020-07-22 ENCOUNTER — Encounter (HOSPITAL_BASED_OUTPATIENT_CLINIC_OR_DEPARTMENT_OTHER): Payer: Self-pay | Admitting: Obstetrics and Gynecology

## 2020-07-22 ENCOUNTER — Ambulatory Visit (HOSPITAL_BASED_OUTPATIENT_CLINIC_OR_DEPARTMENT_OTHER)
Admission: RE | Admit: 2020-07-22 | Discharge: 2020-07-22 | Disposition: A | Discharge status: Home / Self Care | Payer: Managed Care, Other (non HMO) | Attending: Obstetrics and Gynecology | Admitting: Obstetrics and Gynecology

## 2020-07-22 DIAGNOSIS — C530 Malignant neoplasm of endocervix: Secondary | ICD-10-CM | POA: Diagnosis present

## 2020-07-22 DIAGNOSIS — Z87891 Personal history of nicotine dependence: Secondary | ICD-10-CM | POA: Insufficient documentation

## 2020-07-22 DIAGNOSIS — Z20822 Contact with and (suspected) exposure to covid-19: Secondary | ICD-10-CM | POA: Insufficient documentation

## 2020-07-22 HISTORY — PX: CERVICAL CONIZATION W/BX: SHX1330

## 2020-07-22 HISTORY — DX: High grade squamous intraepithelial lesion on cytologic smear of cervix (HGSIL): R87.613

## 2020-07-22 HISTORY — DX: Presence of spectacles and contact lenses: Z97.3

## 2020-07-22 HISTORY — DX: Carcinoma in situ of endocervix: D06.0

## 2020-07-22 LAB — TYPE AND SCREEN
ABO/RH(D): O POS
Antibody Screen: NEGATIVE

## 2020-07-22 LAB — RESP PANEL BY RT-PCR (FLU A&B, COVID) ARPGX2
Influenza A by PCR: NEGATIVE
Influenza B by PCR: NEGATIVE
SARS Coronavirus 2 by RT PCR: NEGATIVE

## 2020-07-22 LAB — CBC
HCT: 42.1 % (ref 36.0–46.0)
Hemoglobin: 14.1 g/dL (ref 12.0–15.0)
MCH: 30.1 pg (ref 26.0–34.0)
MCHC: 33.5 g/dL (ref 30.0–36.0)
MCV: 89.8 fL (ref 80.0–100.0)
Platelets: 382 10*3/uL (ref 150–400)
RBC: 4.69 MIL/uL (ref 3.87–5.11)
RDW: 12.8 % (ref 11.5–15.5)
WBC: 10.8 10*3/uL — ABNORMAL HIGH (ref 4.0–10.5)
nRBC: 0 % (ref 0.0–0.2)

## 2020-07-22 LAB — POCT PREGNANCY, URINE: Preg Test, Ur: NEGATIVE

## 2020-07-22 SURGERY — CONE BIOPSY, CERVIX
Anesthesia: General | Site: Vagina

## 2020-07-22 MED ORDER — ACETAMINOPHEN 500 MG PO TABS
ORAL_TABLET | ORAL | Status: AC
  Filled 2020-07-22: qty 2

## 2020-07-22 MED ORDER — BUPIVACAINE HCL (PF) 0.25 % IJ SOLN
INTRAMUSCULAR | Status: DC | PRN
  Administered 2020-07-22: 20 mL

## 2020-07-22 MED ORDER — ONDANSETRON HCL 4 MG/2ML IJ SOLN
INTRAMUSCULAR | Status: AC
  Filled 2020-07-22: qty 2

## 2020-07-22 MED ORDER — IODINE STRONG (LUGOLS) 5 % PO SOLN
ORAL | Status: DC | PRN
  Administered 2020-07-22: 0.2 mL via ORAL

## 2020-07-22 MED ORDER — OXYCODONE HCL 5 MG PO TABS
5.0000 mg | ORAL_TABLET | Freq: Once | ORAL | Status: AC | PRN
  Administered 2020-07-22: 5 mg via ORAL

## 2020-07-22 MED ORDER — OXYCODONE HCL 5 MG PO TABS
ORAL_TABLET | ORAL | Status: AC
  Filled 2020-07-22: qty 1

## 2020-07-22 MED ORDER — KETOROLAC TROMETHAMINE 30 MG/ML IJ SOLN
30.0000 mg | Freq: Once | INTRAMUSCULAR | Status: AC | PRN
  Administered 2020-07-22: 30 mg via INTRAVENOUS

## 2020-07-22 MED ORDER — FERRIC SUBSULFATE SOLN
Status: DC | PRN
  Administered 2020-07-22: 1

## 2020-07-22 MED ORDER — HYDROMORPHONE HCL 1 MG/ML IJ SOLN
INTRAMUSCULAR | Status: AC
  Filled 2020-07-22: qty 1

## 2020-07-22 MED ORDER — CEFAZOLIN SODIUM-DEXTROSE 2-4 GM/100ML-% IV SOLN
INTRAVENOUS | Status: AC
  Filled 2020-07-22: qty 100

## 2020-07-22 MED ORDER — CEFAZOLIN SODIUM-DEXTROSE 2-4 GM/100ML-% IV SOLN
2.0000 g | INTRAVENOUS | Status: AC
  Administered 2020-07-22: 2 g via INTRAVENOUS

## 2020-07-22 MED ORDER — PROPOFOL 10 MG/ML IV BOLUS
INTRAVENOUS | Status: AC
  Filled 2020-07-22: qty 20

## 2020-07-22 MED ORDER — WHITE PETROLATUM EX OINT
TOPICAL_OINTMENT | CUTANEOUS | Status: AC
  Filled 2020-07-22: qty 5

## 2020-07-22 MED ORDER — MIDAZOLAM HCL 2 MG/2ML IJ SOLN
INTRAMUSCULAR | Status: AC
  Filled 2020-07-22: qty 2

## 2020-07-22 MED ORDER — LIDOCAINE 2% (20 MG/ML) 5 ML SYRINGE
INTRAMUSCULAR | Status: DC | PRN
  Administered 2020-07-22: 40 mg via INTRAVENOUS

## 2020-07-22 MED ORDER — MEPERIDINE HCL 25 MG/ML IJ SOLN: 6.2500 mg | INTRAMUSCULAR | Status: DC | PRN

## 2020-07-22 MED ORDER — POVIDONE-IODINE 10 % EX SWAB: 2.0000 "application " | Freq: Once | CUTANEOUS | Status: DC

## 2020-07-22 MED ORDER — HYDROMORPHONE HCL 1 MG/ML IJ SOLN
0.2500 mg | INTRAMUSCULAR | Status: DC | PRN
Start: 2020-07-22 — End: 2020-07-22

## 2020-07-22 MED ORDER — ONDANSETRON HCL 4 MG/2ML IJ SOLN
INTRAMUSCULAR | Status: DC | PRN
  Administered 2020-07-22: 4 mg via INTRAVENOUS

## 2020-07-22 MED ORDER — FENTANYL CITRATE (PF) 100 MCG/2ML IJ SOLN
INTRAMUSCULAR | Status: AC
  Filled 2020-07-22: qty 4

## 2020-07-22 MED ORDER — LACTATED RINGERS IV SOLN: INTRAVENOUS | Status: DC

## 2020-07-22 MED ORDER — LIDOCAINE HCL (PF) 2 % IJ SOLN
INTRAMUSCULAR | Status: AC
  Filled 2020-07-22: qty 5

## 2020-07-22 MED ORDER — OXYCODONE HCL 5 MG/5ML PO SOLN: 5.0000 mg | Freq: Once | ORAL | Status: AC | PRN

## 2020-07-22 MED ORDER — KETOROLAC TROMETHAMINE 30 MG/ML IJ SOLN
INTRAMUSCULAR | Status: AC
  Filled 2020-07-22: qty 1

## 2020-07-22 MED ORDER — ACETAMINOPHEN 500 MG PO TABS
1000.0000 mg | ORAL_TABLET | Freq: Once | ORAL | Status: AC
  Administered 2020-07-22: 1000 mg via ORAL

## 2020-07-22 MED ORDER — DEXAMETHASONE SODIUM PHOSPHATE 10 MG/ML IJ SOLN
INTRAMUSCULAR | Status: DC | PRN
  Administered 2020-07-22: 10 mg via INTRAVENOUS

## 2020-07-22 MED ORDER — HYDROMORPHONE HCL 1 MG/ML IJ SOLN
0.2500 mg | INTRAMUSCULAR | Status: DC | PRN
  Administered 2020-07-22: 0.25 mg via INTRAVENOUS

## 2020-07-22 MED ORDER — DEXAMETHASONE SODIUM PHOSPHATE 10 MG/ML IJ SOLN
INTRAMUSCULAR | Status: AC
  Filled 2020-07-22: qty 1

## 2020-07-22 MED ORDER — TRAMADOL HCL 50 MG PO TABS: 50.0000 mg | ORAL_TABLET | Freq: Four times a day (QID) | ORAL | 0 refills | Status: DC | PRN

## 2020-07-22 MED ORDER — PROMETHAZINE HCL 25 MG/ML IJ SOLN: 6.2500 mg | INTRAMUSCULAR | Status: DC | PRN

## 2020-07-22 MED ORDER — VASOPRESSIN 20 UNIT/ML IV SOLN
INTRAVENOUS | Status: DC | PRN
  Administered 2020-07-22: 40 mL via INTRAMUSCULAR

## 2020-07-22 MED ORDER — FENTANYL CITRATE (PF) 100 MCG/2ML IJ SOLN
INTRAMUSCULAR | Status: DC | PRN
  Administered 2020-07-22: 50 ug via INTRAVENOUS
  Administered 2020-07-22: 25 ug via INTRAVENOUS
  Administered 2020-07-22: 50 ug via INTRAVENOUS

## 2020-07-22 MED ORDER — PROPOFOL 10 MG/ML IV BOLUS
INTRAVENOUS | Status: DC | PRN
  Administered 2020-07-22: 150 mg via INTRAVENOUS

## 2020-07-22 MED ORDER — DEXMEDETOMIDINE (PRECEDEX) IN NS 20 MCG/5ML (4 MCG/ML) IV SYRINGE
PREFILLED_SYRINGE | INTRAVENOUS | Status: DC | PRN
  Administered 2020-07-22 (×2): 8 ug via INTRAVENOUS

## 2020-07-22 MED ORDER — MIDAZOLAM HCL 5 MG/5ML IJ SOLN
INTRAMUSCULAR | Status: DC | PRN
  Administered 2020-07-22: 2 mg via INTRAVENOUS

## 2020-07-22 SURGICAL SUPPLY — 32 items
APL SWBSTK 6 STRL LF DISP (MISCELLANEOUS) ×1
APPLICATOR COTTON TIP 6 STRL (MISCELLANEOUS) IMPLANT
APPLICATOR COTTON TIP 6IN STRL (MISCELLANEOUS) ×2
BAG DECANTER FOR FLEXI CONT (MISCELLANEOUS) ×2 IMPLANT
BLADE MINI RND TIP GREEN BEAV (BLADE) ×1 IMPLANT
BLADE SURG 11 STRL SS (BLADE) ×2 IMPLANT
DECANTER SPIKE VIAL GLASS SM (MISCELLANEOUS) ×1 IMPLANT
ELECT BALL LEEP 5MM RED (ELECTRODE) ×1 IMPLANT
ELECT LLETZ BALL 5MM DISP (ELECTRODE) ×1 IMPLANT
ELECT REM PT RETURN 9FT ADLT (ELECTROSURGICAL) ×2
ELECTRODE REM PT RTRN 9FT ADLT (ELECTROSURGICAL) ×1 IMPLANT
GLOVE SURG ENC MOIS LTX SZ7.5 (GLOVE) ×2 IMPLANT
GLOVE SURG POLYISO LF SZ6.5 (GLOVE) ×1 IMPLANT
GLOVE SURG UNDER POLY LF SZ7 (GLOVE) ×2 IMPLANT
GOWN STRL REUS W/TWL LRG LVL3 (GOWN DISPOSABLE) ×5 IMPLANT
HEMOSTAT SURGICEL 2X14 (HEMOSTASIS) ×1 IMPLANT
IV CATH 18G SAFETY (IV SOLUTION) ×1 IMPLANT
KIT TURNOVER CYSTO (KITS) ×2 IMPLANT
LEGGING LITHOTOMY PAIR STRL (DRAPES) ×1 IMPLANT
MANIFOLD NEPTUNE II (INSTRUMENTS) ×1 IMPLANT
NS IRRIG 500ML POUR BTL (IV SOLUTION) ×1 IMPLANT
PACK VAGINAL MINOR WOMEN LF (CUSTOM PROCEDURE TRAY) ×2 IMPLANT
PAD OB MATERNITY 4.3X12.25 (PERSONAL CARE ITEMS) ×2 IMPLANT
PENCIL SMOKE EVACUATOR (MISCELLANEOUS) ×2 IMPLANT
SCOPETTES 8  STERILE (MISCELLANEOUS) ×2
SCOPETTES 8 STERILE (MISCELLANEOUS) ×2 IMPLANT
SPONGE SURGIFOAM ABS GEL 12-7 (HEMOSTASIS) IMPLANT
SUT CHROMIC 0 CT 1 (SUTURE) IMPLANT
SUT VIC AB 0 CT1 36 (SUTURE) ×2 IMPLANT
SUT VIC AB 2-0 SH 27 (SUTURE) ×2
SUT VIC AB 2-0 SH 27XBRD (SUTURE) IMPLANT
SYR 5ML LL (SYRINGE) ×1 IMPLANT

## 2020-07-22 NOTE — Op Note (Signed)
NAMEDORICE, STIGGERS MEDICAL RECORD UY:[id number] ACCOUNT [id number] DATE OF BIRTH:Aug 29, 1975 FACILITY: [facility] LOCATION: [facility] PHYSICIAN:Amel Kitch J. Sedale Jenifer, MD  OPERATIVE REPORT  DATE OF PROCEDURE:  07/22/2020  PREOPERATIVE DIAGNOSIS:  Abnormal Pap smear with high-grade squamous intraepithelial lesion and endocervical adenocarcinoma in situ documented by colposcopy.  POSTOPERATIVE DIAGNOSIS:  Abnormal Pap smear with high-grade squamous intraepithelial lesion and endocervical adenocarcinoma in situ documented by colposcopy.  PROCEDURE:  Cervical cone biopsy.  SURGEON:  Brien Few, MD  ASSISTANT:  None.  ANESTHESIA:  Local, general.  ESTIMATED BLOOD LOSS:  50 mL.  DRAINS:  None.  COUNTS:  Correct.  DISPOSITION:  The patient was taken to recovery in good condition.  BRIEF OPERATIVE NOTE:  After being apprised of the risks of anesthesia, infection, bleeding, and surrounding organs, possible need for repair, delayed versus immediate complications including bowel and bladder injury, possible need for repair.  The  patient was brought to the operating room and was administered general anesthetic without complications.  Prepped and draped in usual sterile fashion, catheterized until the bladder was emptied.  Speculum placed, single tooth tenaculum placed in the  anterior and posterior lip of the cervix.  Lugol solution placed to outline the zone of the lesion.  At this time, a 0 Vicryl stay sutures placed at 3 and 9 o'clock at the cervicouterine junction without complication and held.  A dilute Pitressin  solution placed circumferentially intracervically 10 mL in each quadrant 40 mL total.  At this time, the Lugol solution having outlined the zone for resection a angulated 11 blade was used to resect a cone specimen without difficulty.  The specimen was  removed intact starting at 4 o'clock circumferentially and marked at 12 o'clock using a suture.  Hemostasis was applied using  electrocautery to the cone bed then a Monsel solution was placed.  Then, Surgicel wick is placed covered in Monsel solution.   The stay sutures at 3 and 9 o'clock are tied in the midline over the Surgicel wick.  Minimal bleeding was noted.  The patient tolerated the procedure well, was awakened and transferred to recovery in good condition.  HN/NUANCE  D:07/22/2020 T:07/22/2020 JOB:[id number]

## 2020-07-22 NOTE — Anesthesia Procedure Notes (Signed)
Procedure Name: LMA Insertion Date/Time: 07/22/2020 10:04 AM Performed by: Rogers Blocker, CRNA Pre-anesthesia Checklist: Patient identified, Emergency Drugs available, Suction available and Patient being monitored Patient Re-evaluated:Patient Re-evaluated prior to induction Oxygen Delivery Method: Circle System Utilized Preoxygenation: Pre-oxygenation with 100% oxygen Induction Type: IV induction Ventilation: Mask ventilation without difficulty LMA: LMA inserted LMA Size: 4.0 Number of attempts: 1 Placement Confirmation: positive ETCO2 Tube secured with: Tape Dental Injury: Teeth and Oropharynx as per pre-operative assessment

## 2020-07-22 NOTE — Anesthesia Preprocedure Evaluation (Addendum)
Anesthesia Evaluation  Patient identified by MRN, date of birth, ID band Patient awake    Reviewed: Allergy & Precautions, NPO status , Patient's Chart, lab work & pertinent test results  Airway Mallampati: I  TM Distance: >3 FB Neck ROM: Full    Dental no notable dental hx. (+) Teeth Intact, Dental Advisory Given   Pulmonary former smoker,  Quit smoking 1992   Pulmonary exam normal breath sounds clear to auscultation       Cardiovascular negative cardio ROS Normal cardiovascular exam Rhythm:Regular Rate:Normal     Neuro/Psych negative neurological ROS  negative psych ROS   GI/Hepatic negative GI ROS, Neg liver ROS,   Endo/Other  negative endocrine ROS  Renal/GU negative Renal ROS  negative genitourinary   Musculoskeletal negative musculoskeletal ROS (+)   Abdominal   Peds  Hematology negative hematology ROS (+)   Anesthesia Other Findings High grade SIL, endocervical adenocarcinoma   Reproductive/Obstetrics negative OB ROS                            Anesthesia Physical Anesthesia Plan  ASA: II  Anesthesia Plan: General   Post-op Pain Management:    Induction: Intravenous  PONV Risk Score and Plan: 3 and Ondansetron and Dexamethasone  Airway Management Planned:   Additional Equipment: None  Intra-op Plan:   Post-operative Plan: Extubation in OR  Informed Consent: I have reviewed the patients History and Physical, chart, labs and discussed the procedure including the risks, benefits and alternatives for the proposed anesthesia with the patient or authorized representative who has indicated his/her understanding and acceptance.     Dental advisory given  Plan Discussed with: CRNA  Anesthesia Plan Comments:        Anesthesia Quick Evaluation

## 2020-07-22 NOTE — Discharge Instructions (Signed)

## 2020-07-22 NOTE — Op Note (Signed)
07/22/2020  10:46 AM  PATIENT:  Taylor Chaney  45 y.o. female  PRE-OPERATIVE DIAGNOSIS:  High Grade Squamous Intraepithelial Lesion, Endocervical Adenocarcinoma In-Situ  POST-OPERATIVE DIAGNOSIS:  High Grade Squamous Intraepithelial Lesion, Endocervical Adenocarcinoma In-Situ  PROCEDURE:  Procedure(s): CONIZATION CERVIX WITH BIOPSY  SURGEON:  Surgeon(s): Brien Few, MD  ASSISTANTS: none   ANESTHESIA:   local and general  ESTIMATED BLOOD LOSS: minimal   DRAINS: none   LOCAL MEDICATIONS USED:  MARCAINE    and Amount: 20 ml  SPECIMEN:  Source of Specimen:  cervical cone specimen , intact, suture marker at 1200  DISPOSITION OF SPECIMEN:  PATHOLOGY  COUNTS:  YES  DICTATION #: [id number]  PLAN OF CARE: dc home  PATIENT DISPOSITION:  PACU - hemodynamically stable.

## 2020-07-22 NOTE — Progress Notes (Signed)
Patient seen and examined. Consent witnessed and signed. No changes noted. Update completed. BP (!) 159/98   Pulse 78   Temp (!) 97.4 F (36.3 C) (Oral)   Resp 14   Ht 4\' 9"  (1.448 m)   Wt 54.1 kg   SpO2 100%   Breastfeeding Unknown   BMI 25.82 kg/m   CBC    Component Value Date/Time   WBC 10.8 (H) 07/22/2020 0835   RBC 4.69 07/22/2020 0835   HGB 14.1 07/22/2020 0835   HCT 42.1 07/22/2020 0835   PLT 382 07/22/2020 0835   MCV 89.8 07/22/2020 0835   MCH 30.1 07/22/2020 0835   MCHC 33.5 07/22/2020 0835   RDW 12.8 07/22/2020 0835   LYMPHSABS 3.4 10/14/2017 1930   MONOABS 0.4 10/14/2017 1930   EOSABS 0.2 10/14/2017 1930   BASOSABS 0.0 10/14/2017 1930

## 2020-07-22 NOTE — Anesthesia Postprocedure Evaluation (Signed)
Anesthesia Post Note  Patient: Tempest Frankland  Procedure(s) Performed: CONIZATION CERVIX WITH BIOPSY (N/A Vagina )     Patient location during evaluation: PACU Anesthesia Type: General Level of consciousness: awake and alert, oriented and patient cooperative Pain management: pain level controlled Vital Signs Assessment: post-procedure vital signs reviewed and stable Respiratory status: spontaneous breathing, nonlabored ventilation and respiratory function stable Cardiovascular status: blood pressure returned to baseline and stable Postop Assessment: no apparent nausea or vomiting Anesthetic complications: no   No complications documented.  Last Vitals:  Vitals:   07/22/20 1145 07/22/20 1200  BP: (!) 142/87 (!) 137/99  Pulse: 67 70  Resp: 15 17  Temp:    SpO2: 98% 98%    Last Pain:  Vitals:   07/22/20 1200  TempSrc:   PainSc: [place]

## 2020-07-22 NOTE — H&P (Signed)
Taylor Chaney is an 45 y.o. female. HGSIL and Endocervical Adenocarcinoma In situ on colpo for Cervical Cone Biopsy.  Pertinent Gynecological History: Menses: flow is moderate Bleeding: na Contraception: vasectomy DES exposure: denies Blood transfusions: none Sexually transmitted diseases: no past history Previous GYN Procedures: DNC  Last mammogram: normal Date: 2021 Last pap: abnormal: per above Date: 2022 OB History: G3, P2   Menstrual History: Menarche age: 7 No LMP recorded. (Menstrual status: Oral contraceptives).    Past Medical History:  Diagnosis Date  . Adenocarcinoma in situ of endocervix   . HGSIL (high grade squamous intraepithelial lesion) on Pap smear of cervix   . History of kidney stones    passed stones - no surgery required  . Wears glasses     Past Surgical History:  Procedure Laterality Date  . DILATION AND EVACUATION N/A 04/08/2014   Procedure: DILATATION AND EVACUATION;  Surgeon: Lovenia Kim, MD;  Location: [facility];  Service: Gynecology;  Laterality: N/A;  . DILITATION & CURRETTAGE/HYSTROSCOPY WITH VERSAPOINT RESECTION N/A 06/22/2014   Procedure: DILATATION & CURETTAGE/HYSTEROSCOPY WITH VERSAPOINT RESECTION;  Surgeon: Lovenia Kim, MD;  Location: [facility];  Service: Gynecology;  Laterality: N/A;  . TONSILLECTOMY  child    History reviewed. No pertinent family history.  Social History:  reports that she quit smoking about 30 years ago. Her smoking use included cigarettes. She quit after 0.50 years of use. She has never used smokeless tobacco. She reports that she does not drink alcohol and does not use drugs.  Allergies: No Known Allergies  No medications prior to admission.    Review of Systems  Constitutional: Negative.   All other systems reviewed and are negative.   Height 4\' 10"  (1.473 m), weight 53.1 kg, unknown if currently breastfeeding. Physical Exam Vitals and nursing note reviewed.  Constitutional:      Appearance: Normal  appearance.  HENT:     Head: Normocephalic and atraumatic.  Cardiovascular:     Rate and Rhythm: Normal rate and regular rhythm.     Pulses: Normal pulses.     Heart sounds: Normal heart sounds.  Pulmonary:     Effort: Pulmonary effort is normal.     Breath sounds: Normal breath sounds.  Abdominal:     General: Abdomen is flat.     Palpations: Abdomen is soft.  Genitourinary:    General: Normal vulva.     Rectum: Normal.  Musculoskeletal:        General: Normal range of motion.     Cervical back: Normal range of motion and neck supple.  Skin:    General: Skin is warm and dry.  Neurological:     General: No focal deficit present.     Mental Status: She is alert and oriented to person, place, and time.  Psychiatric:        Mood and Affect: Mood normal.        Behavior: Behavior normal.     No results found for this or any previous visit (from the past 24 hour(s)).  No results found.  Assessment/Plan: HGSIL and Endocervical AdenoCA In Situ Cervical Cone Bx Surgical risks discussed.Consent done. Risks of bleeding, injury to adjacent organs with need for repair discussed.    Taylor Chaney J 07/22/2020, 6:33 AM

## 2020-07-22 NOTE — Transfer of Care (Signed)
Immediate Anesthesia Transfer of Care Note  Patient: Taylor Chaney  Procedure(s) Performed: CONIZATION CERVIX WITH BIOPSY (N/A Vagina )  Patient Location: PACU  Anesthesia Type:General  Level of Consciousness: drowsy, patient cooperative and responds to stimulation  Airway & Oxygen Therapy: Pt spont breathing  Post-op Assessment: Report given to RN and Post -op Vital signs reviewed and stable  Post vital signs: Reviewed and stable  Last Vitals:  Vitals Value Taken Time  BP 160/99 07/22/20 1052  Temp 36.8 C 07/22/20 1051  Pulse 71 07/22/20 1057  Resp 20 07/22/20 1057  SpO2 99 % 07/22/20 1057  Vitals shown include unvalidated device data.  Last Pain:  Vitals:   07/22/20 0814  TempSrc: Oral         Complications: No complications documented.

## 2020-07-23 ENCOUNTER — Encounter (HOSPITAL_BASED_OUTPATIENT_CLINIC_OR_DEPARTMENT_OTHER): Payer: Self-pay | Admitting: Obstetrics and Gynecology

## 2020-07-23 LAB — SURGICAL PATHOLOGY

## 2021-04-20 ENCOUNTER — Other Ambulatory Visit: Payer: Self-pay | Admitting: Obstetrics and Gynecology

## 2021-05-13 ENCOUNTER — Other Ambulatory Visit: Payer: Self-pay

## 2021-05-13 ENCOUNTER — Encounter (HOSPITAL_BASED_OUTPATIENT_CLINIC_OR_DEPARTMENT_OTHER): Payer: Self-pay | Admitting: Obstetrics and Gynecology

## 2021-05-13 DIAGNOSIS — Z01812 Encounter for preprocedural laboratory examination: Secondary | ICD-10-CM | POA: Diagnosis present

## 2021-05-13 NOTE — Progress Notes (Addendum)
Spoke w/ via phone for pre-op interview---pt Lab needs dos----   urine preg poct            Lab results------lab appt 05-18-2021 900 am due to pt flying in from [place] after 500 pm on 05-17-2021 COVID test -----patient states asymptomatic no test needed Arrive at -------1130 am 05-19-2021 NPO after MN NO Solid Food.  Clear liquids from MN until---1030 am Med rec completed Medications to take morning of surgery -----none Diabetic medication -----n/a Patient instructed no nail polish to be worn day of surgery Patient instructed to bring photo id and insurance card day of surgery Patient aware to have Driver (ride ) / caregiver    for 24 hours after surgery  cousin tam burch or friend stacy robinette Patient Special Instructions -----pt given overnight stay instructions Pre-Op special Istructions -----follow all bowel prep instructions given by dr Ronita Hipps Patient verbalized understanding of instructions that were given at this phone interview. Patient denies shortness of breath, chest pain, fever, cough at this phone interview.

## 2021-05-13 NOTE — Progress Notes (Signed)
PLEASE WEAR A MASK OUT IN PUBLIC AND SOCIAL DISTANCE AND [place] YOUR HANDS FREQUENTLY. PLEASE ASK ALL YOUR CLOSE HOUSEHOLD CONTACT TO WEAR MASK OUT IN PUBLIC AND SOCIAL DISTANCE AND [place] HANDS FREQUENTLY ALSO.      Your procedure is scheduled on 05-19-2021  Report to [facility]   Call this number if you have problems the morning of surgery  :[phone].   OUR ADDRESS IS [address].  WE ARE LOCATED IN [facility].  PLEASE BRING YOUR INSURANCE CARD AND PHOTO ID DAY OF SURGERY.  ONLY ONE PERSON ALLOWED IN FACILITY WAITING AREA.                                     REMEMBER:  DO NOT EAT FOOD, CANDY GUM OR MINTS  AFTER MI1030 AM. NO CLEAR LIQUIDS AFTER  1030 AM DAY OF SURGERY.   YOU MAY  BRUSH YOUR TEETH MORNING OF SURGERY AND RINSE YOUR MOUTH OUT, NO CHEWING GUM CANDY OR MINTS.    CLEAR LIQUID DIET   Foods Allowed                                                                     Foods Excluded  Coffee and tea, regular and decaf                             liquids that you cannot  Plain Jell-O any favor except red or purple                                           see through such as: Fruit ices (not with fruit pulp)                                     milk, soups, orange juice  Iced Popsicles                                    All solid food Carbonated beverages, regular and diet                                    Cranberry, grape and apple juices Sports drinks like Gatorade Lightly seasoned clear broth or consume(fat free) Sugar  Sample Menu Breakfast                                Lunch                                     Supper Cranberry juice  Jell-O                                     Grape juice                           Apple juice Coffee or tea                        Jell-O                                      Popsicle                                                 Coffee or tea                        Coffee or tea  _____________________________________________________________________     TAKE THESE MEDICATIONS MORNING OF SURGERY WITH A SIP OF WATER:NONE  ONE VISITOR IS ALLOWED IN WAITING ROOM ONLY DAY OF SURGERY.  YOU MAY HAVE ANOTHER PERSON SWITCH OUT WITH THE  1  VISITOR IN THE WAITING ROOM DAY OF SURGERY AND A MASK MUST BE WORN IN THE WAITING ROOM.    2 VISITORS  MAY VISIT IN THE EXTENDED RECOVERY ROOM UNTIL 800 PM ONLY 1 VISITOR AGE 46 AND OVER MAY SPEND THE NIGHT AND MUST BE IN EXTENDED RECOVERY ROOM NO LATER THAN 800 PM .   UP TO 2 CHILDREN AGE 7 TO 15 MAY ALSO VISIT IN EXTENDED RECOVERY ROOM ONLY UNTIL 800 PM AND MUST LEAVE BY 800 PM. ALL PERSONS VISITING IN EXTENDED RECOVERY ROOM MUST WEAR A MASK.                                    DO NOT WEAR JEWERLY, MAKE UP. DO NOT WEAR LOTIONS, POWDERS, PERFUMES OR NAIL POLISH ON YOUR FINGERNAILS. TOENAIL POLISH IS OK TO WEAR. DO NOT SHAVE FOR 48 HOURS PRIOR TO DAY OF SURGERY. MEN MAY SHAVE FACE AND NECK. CONTACTS, GLASSES, OR DENTURES MAY NOT BE WORN TO SURGERY.                                    [facility] IS NOT RESPONSIBLE  FOR ANY BELONGINGS.                                                                    Marland Kitchen            - Preparing for Surgery Before surgery, you can play an important role.  Because skin is not sterile, your skin needs to be as free of germs as possible.  You can reduce the number of germs on your skin by washing with  CHG (chlorahexidine gluconate) soap before surgery.  CHG is an antiseptic cleaner which kills germs and bonds with the skin to continue killing germs even after washing. Please DO NOT use if you have an allergy to CHG or antibacterial soaps.  If your skin becomes reddened/irritated stop using the CHG and inform your nurse when you arrive at Short Stay. Do not shave (including legs and underarms) for at least 48 hours prior to the first CHG shower.  You may  shave your face/neck. Please follow these instructions carefully:  1.  Shower with CHG Soap the night before surgery and the  morning of Surgery.  2.  If you choose to wash your hair, wash your hair first as usual with your  normal  shampoo.  3.  After you shampoo, rinse your hair and body thoroughly to remove the  shampoo.                            4.  Use CHG as you would any other liquid soap.  You can apply chg directly  to the skin and wash                      Gently with a scrungie or clean washcloth.  5.  Apply the CHG Soap to your body ONLY FROM THE NECK DOWN.   Do not use on face/ open                           Wound or open sores. Avoid contact with eyes, ears mouth and genitals (private parts).                       Wash face,  Genitals (private parts) with your normal soap.             6.  Wash thoroughly, paying special attention to the area where your surgery  will be performed.  7.  Thoroughly rinse your body with warm water from the neck down.  8.  DO NOT shower/wash with your normal soap after using and rinsing off  the CHG Soap.                9.  Pat yourself dry with a clean towel.            10.  Wear clean pajamas.            11.  Place clean sheets on your bed the night of your first shower and do not  sleep with pets. Day of Surgery : Do not apply any lotions/deodorants the morning of surgery.  Please wear clean clothes to the hospital/surgery center.  IF YOU HAVE ANY SKIN IRRITATION OR PROBLEMS WITH THE SURGICAL SOAP, PLEASE GET A BAR OF GOLD DIAL SOAP AND SHOWER THE NIGHT BEFORE YOUR SURGERY AND THE MORNING OF YOUR SURGERY. PLEASE LET THE NURSE KNOW MORNING OF YOUR SURGERY IF YOU HAD ANY PROBLEMS WITH THE SURGICAL SOAP.  FAILURE TO FOLLOW THESE INSTRUCTIONS MAY RESULT IN THE CANCELLATION OF YOUR SURGERY PATIENT SIGNATURE_________________________________  NURSE  SIGNATURE__________________________________  ________________________________________________________________________  QUESTIONS CALL Saquan Furtick PRE OP NURSE PHONE [phone].

## 2021-05-18 ENCOUNTER — Encounter (HOSPITAL_COMMUNITY)
Admission: RE | Admit: 2021-05-18 | Discharge: 2021-05-18 | Disposition: A | Discharge status: Home / Self Care | Payer: Managed Care, Other (non HMO) | Source: Ambulatory Visit | Attending: Obstetrics and Gynecology | Admitting: Obstetrics and Gynecology

## 2021-05-18 ENCOUNTER — Other Ambulatory Visit: Payer: Self-pay

## 2021-05-18 DIAGNOSIS — Z01812 Encounter for preprocedural laboratory examination: Secondary | ICD-10-CM | POA: Diagnosis not present

## 2021-05-18 LAB — CBC
HCT: 40.6 % (ref 36.0–46.0)
Hemoglobin: 13.3 g/dL (ref 12.0–15.0)
MCH: 29.4 pg (ref 26.0–34.0)
MCHC: 32.8 g/dL (ref 30.0–36.0)
MCV: 89.8 fL (ref 80.0–100.0)
Platelets: 388 10*3/uL (ref 150–400)
RBC: 4.52 MIL/uL (ref 3.87–5.11)
RDW: 12.9 % (ref 11.5–15.5)
WBC: 8.2 10*3/uL (ref 4.0–10.5)
nRBC: 0 % (ref 0.0–0.2)

## 2021-05-19 ENCOUNTER — Ambulatory Visit (HOSPITAL_BASED_OUTPATIENT_CLINIC_OR_DEPARTMENT_OTHER): Payer: Managed Care, Other (non HMO) | Admitting: Anesthesiology

## 2021-05-19 ENCOUNTER — Encounter (HOSPITAL_BASED_OUTPATIENT_CLINIC_OR_DEPARTMENT_OTHER)
Admission: RE | Disposition: A | Discharge status: Home / Self Care | Payer: Self-pay | Source: Home / Self Care | Attending: Obstetrics and Gynecology

## 2021-05-19 ENCOUNTER — Ambulatory Visit (HOSPITAL_BASED_OUTPATIENT_CLINIC_OR_DEPARTMENT_OTHER)
Admission: RE | Admit: 2021-05-19 | Discharge: 2021-05-20 | Disposition: A | Discharge status: Home / Self Care | Payer: Managed Care, Other (non HMO) | Attending: Obstetrics and Gynecology | Admitting: Obstetrics and Gynecology

## 2021-05-19 ENCOUNTER — Encounter (HOSPITAL_BASED_OUTPATIENT_CLINIC_OR_DEPARTMENT_OTHER): Payer: Self-pay | Admitting: Obstetrics and Gynecology

## 2021-05-19 ENCOUNTER — Other Ambulatory Visit: Payer: Self-pay

## 2021-05-19 DIAGNOSIS — Z87891 Personal history of nicotine dependence: Secondary | ICD-10-CM | POA: Insufficient documentation

## 2021-05-19 DIAGNOSIS — C539 Malignant neoplasm of cervix uteri, unspecified: Secondary | ICD-10-CM | POA: Diagnosis present

## 2021-05-19 DIAGNOSIS — C530 Malignant neoplasm of endocervix: Secondary | ICD-10-CM | POA: Diagnosis present

## 2021-05-19 DIAGNOSIS — D259 Leiomyoma of uterus, unspecified: Secondary | ICD-10-CM | POA: Diagnosis not present

## 2021-05-19 HISTORY — PX: ROBOTIC ASSISTED LAPAROSCOPIC HYSTERECTOMY AND SALPINGECTOMY: SHX6379

## 2021-05-19 LAB — POCT PREGNANCY, URINE: Preg Test, Ur: NEGATIVE

## 2021-05-19 LAB — TYPE AND SCREEN
ABO/RH(D): O POS
Antibody Screen: NEGATIVE

## 2021-05-19 SURGERY — XI ROBOTIC ASSISTED LAPAROSCOPIC HYSTERECTOMY AND SALPINGECTOMY
Anesthesia: General | Site: Abdomen | Laterality: Bilateral

## 2021-05-19 MED ORDER — CEFAZOLIN SODIUM-DEXTROSE 2-4 GM/100ML-% IV SOLN
INTRAVENOUS | Status: AC
  Filled 2021-05-19: qty 100

## 2021-05-19 MED ORDER — FENTANYL CITRATE (PF) 100 MCG/2ML IJ SOLN
25.0000 ug | INTRAMUSCULAR | Status: DC | PRN
  Administered 2021-05-19: 50 ug via INTRAVENOUS

## 2021-05-19 MED ORDER — ACETAMINOPHEN 500 MG PO TABS
ORAL_TABLET | ORAL | Status: AC
  Filled 2021-05-19: qty 2

## 2021-05-19 MED ORDER — EPHEDRINE 5 MG/ML INJ
INTRAVENOUS | Status: AC
  Filled 2021-05-19: qty 5

## 2021-05-19 MED ORDER — OXYCODONE HCL 5 MG PO TABS
ORAL_TABLET | ORAL | Status: AC
  Filled 2021-05-19: qty 1

## 2021-05-19 MED ORDER — FENTANYL CITRATE (PF) 100 MCG/2ML IJ SOLN
INTRAMUSCULAR | Status: AC
  Filled 2021-05-19: qty 2

## 2021-05-19 MED ORDER — PROPOFOL 10 MG/ML IV BOLUS
INTRAVENOUS | Status: AC
  Filled 2021-05-19: qty 20

## 2021-05-19 MED ORDER — OXYCODONE HCL 5 MG PO TABS
5.0000 mg | ORAL_TABLET | ORAL | Status: DC | PRN
  Administered 2021-05-19: 10 mg via ORAL
  Administered 2021-05-19: 5 mg via ORAL
  Administered 2021-05-20 (×2): 10 mg via ORAL

## 2021-05-19 MED ORDER — FENTANYL CITRATE (PF) 250 MCG/5ML IJ SOLN
INTRAMUSCULAR | Status: AC
  Filled 2021-05-19: qty 5

## 2021-05-19 MED ORDER — DEXTROSE IN LACTATED RINGERS 5 % IV SOLN: INTRAVENOUS | Status: DC

## 2021-05-19 MED ORDER — PROPOFOL 1000 MG/100ML IV EMUL
INTRAVENOUS | Status: AC
  Filled 2021-05-19: qty 100

## 2021-05-19 MED ORDER — KETOROLAC TROMETHAMINE 30 MG/ML IJ SOLN
30.0000 mg | Freq: Four times a day (QID) | INTRAMUSCULAR | Status: DC
  Administered 2021-05-19 – 2021-05-20 (×2): 30 mg via INTRAVENOUS

## 2021-05-19 MED ORDER — OXYCODONE HCL 5 MG PO TABS
ORAL_TABLET | ORAL | Status: AC
  Filled 2021-05-19: qty 2

## 2021-05-19 MED ORDER — AMISULPRIDE (ANTIEMETIC) 5 MG/2ML IV SOLN: 10.0000 mg | Freq: Once | INTRAVENOUS | Status: DC | PRN

## 2021-05-19 MED ORDER — ONDANSETRON HCL 4 MG/2ML IJ SOLN
INTRAMUSCULAR | Status: DC | PRN
  Administered 2021-05-19: 4 mg via INTRAVENOUS

## 2021-05-19 MED ORDER — ONDANSETRON HCL 4 MG/2ML IJ SOLN: 4.0000 mg | Freq: Once | INTRAMUSCULAR | Status: DC | PRN

## 2021-05-19 MED ORDER — OXYCODONE HCL 5 MG PO TABS: 5.0000 mg | ORAL_TABLET | Freq: Once | ORAL | Status: DC | PRN

## 2021-05-19 MED ORDER — IBUPROFEN 200 MG PO TABS: 600.0000 mg | ORAL_TABLET | Freq: Four times a day (QID) | ORAL | Status: DC

## 2021-05-19 MED ORDER — LIDOCAINE 2% (20 MG/ML) 5 ML SYRINGE
INTRAMUSCULAR | Status: DC | PRN
  Administered 2021-05-19: 60 mg via INTRAVENOUS

## 2021-05-19 MED ORDER — STERILE WATER FOR IRRIGATION IR SOLN
Status: DC | PRN
  Administered 2021-05-19: 500 mL

## 2021-05-19 MED ORDER — DEXAMETHASONE SODIUM PHOSPHATE 10 MG/ML IJ SOLN
INTRAMUSCULAR | Status: AC
  Filled 2021-05-19: qty 1

## 2021-05-19 MED ORDER — ROCURONIUM BROMIDE 10 MG/ML (PF) SYRINGE
PREFILLED_SYRINGE | INTRAVENOUS | Status: AC
  Filled 2021-05-19: qty 10

## 2021-05-19 MED ORDER — SODIUM CHLORIDE 0.9 % IR SOLN
Status: DC | PRN
  Administered 2021-05-19: 1000 mL

## 2021-05-19 MED ORDER — ONDANSETRON HCL 4 MG/2ML IJ SOLN
INTRAMUSCULAR | Status: AC
  Filled 2021-05-19: qty 2

## 2021-05-19 MED ORDER — ACETAMINOPHEN 500 MG PO TABS
1000.0000 mg | ORAL_TABLET | Freq: Four times a day (QID) | ORAL | Status: DC
  Administered 2021-05-19 – 2021-05-20 (×2): 1000 mg via ORAL

## 2021-05-19 MED ORDER — SUGAMMADEX SODIUM 200 MG/2ML IV SOLN
INTRAVENOUS | Status: DC | PRN
  Administered 2021-05-19: 200 mg via INTRAVENOUS

## 2021-05-19 MED ORDER — FENTANYL CITRATE (PF) 100 MCG/2ML IJ SOLN
INTRAMUSCULAR | Status: DC | PRN
  Administered 2021-05-19: 50 ug via INTRAVENOUS
  Administered 2021-05-19: 100 ug via INTRAVENOUS
  Administered 2021-05-19: 50 ug via INTRAVENOUS

## 2021-05-19 MED ORDER — KETOROLAC TROMETHAMINE 30 MG/ML IJ SOLN
INTRAMUSCULAR | Status: DC | PRN
  Administered 2021-05-19: 30 mg via INTRAVENOUS

## 2021-05-19 MED ORDER — OXYCODONE HCL 5 MG/5ML PO SOLN: 5.0000 mg | Freq: Once | ORAL | Status: DC | PRN

## 2021-05-19 MED ORDER — ROCURONIUM BROMIDE 10 MG/ML (PF) SYRINGE
PREFILLED_SYRINGE | INTRAVENOUS | Status: DC | PRN
  Administered 2021-05-19: 60 mg via INTRAVENOUS

## 2021-05-19 MED ORDER — BUPIVACAINE HCL (PF) 0.25 % IJ SOLN
INTRAMUSCULAR | Status: DC | PRN
  Administered 2021-05-19: 20 mL

## 2021-05-19 MED ORDER — DEXAMETHASONE SODIUM PHOSPHATE 10 MG/ML IJ SOLN
INTRAMUSCULAR | Status: DC | PRN
  Administered 2021-05-19: 10 mg via INTRAVENOUS

## 2021-05-19 MED ORDER — PROPOFOL 500 MG/50ML IV EMUL
INTRAVENOUS | Status: DC | PRN
  Administered 2021-05-19: 50 ug/kg/min via INTRAVENOUS

## 2021-05-19 MED ORDER — LACTATED RINGERS IV SOLN: INTRAVENOUS | Status: DC

## 2021-05-19 MED ORDER — ACETAMINOPHEN 500 MG PO TABS
1000.0000 mg | ORAL_TABLET | Freq: Once | ORAL | Status: AC
  Administered 2021-05-19: 1000 mg via ORAL

## 2021-05-19 MED ORDER — SODIUM CHLORIDE (PF) 0.9 % IJ SOLN
INTRAMUSCULAR | Status: DC | PRN
  Administered 2021-05-19: 30 mL

## 2021-05-19 MED ORDER — POVIDONE-IODINE 10 % EX SWAB: 2.0000 "application " | Freq: Once | CUTANEOUS | Status: DC

## 2021-05-19 MED ORDER — LIDOCAINE 2% (20 MG/ML) 5 ML SYRINGE
INTRAMUSCULAR | Status: AC
  Filled 2021-05-19: qty 5

## 2021-05-19 MED ORDER — PROPOFOL 10 MG/ML IV BOLUS
INTRAVENOUS | Status: DC | PRN
  Administered 2021-05-19 (×2): 30 mg via INTRAVENOUS
  Administered 2021-05-19: 20 mg via INTRAVENOUS
  Administered 2021-05-19: 170 mg via INTRAVENOUS

## 2021-05-19 MED ORDER — EPHEDRINE SULFATE-NACL 50-0.9 MG/10ML-% IV SOSY
PREFILLED_SYRINGE | INTRAVENOUS | Status: DC | PRN
  Administered 2021-05-19: 5 mg via INTRAVENOUS

## 2021-05-19 MED ORDER — MIDAZOLAM HCL 2 MG/2ML IJ SOLN
INTRAMUSCULAR | Status: AC
  Filled 2021-05-19: qty 2

## 2021-05-19 MED ORDER — ROPIVACAINE HCL 5 MG/ML IJ SOLN
INTRAMUSCULAR | Status: DC | PRN
  Administered 2021-05-19: 30 mL

## 2021-05-19 MED ORDER — CEFAZOLIN SODIUM-DEXTROSE 2-4 GM/100ML-% IV SOLN
2.0000 g | INTRAVENOUS | Status: AC
  Administered 2021-05-19: 2 g via INTRAVENOUS

## 2021-05-19 MED ORDER — MIDAZOLAM HCL 5 MG/5ML IJ SOLN
INTRAMUSCULAR | Status: DC | PRN
  Administered 2021-05-19: 2 mg via INTRAVENOUS

## 2021-05-19 MED ORDER — KETOROLAC TROMETHAMINE 30 MG/ML IJ SOLN
INTRAMUSCULAR | Status: AC
  Filled 2021-05-19: qty 1

## 2021-05-19 SURGICAL SUPPLY — 65 items
ADH SKN CLS APL DERMABOND .7 (GAUZE/BANDAGES/DRESSINGS) ×1
BARRIER ADHS 3X4 INTERCEED (GAUZE/BANDAGES/DRESSINGS) IMPLANT
BRR ADH 4X3 ABS CNTRL BYND (GAUZE/BANDAGES/DRESSINGS)
CATH FOLEY 3WAY  5CC 16FR (CATHETERS) ×2
CATH FOLEY 3WAY 5CC 16FR (CATHETERS) ×1 IMPLANT
COVER BACK TABLE 60X90IN (DRAPES) ×2 IMPLANT
COVER TIP SHEARS 8 DVNC (MISCELLANEOUS) ×1 IMPLANT
COVER TIP SHEARS 8MM DA VINCI (MISCELLANEOUS) ×2
DECANTER SPIKE VIAL GLASS SM (MISCELLANEOUS) ×4 IMPLANT
DEFOGGER SCOPE WARMER CLEARIFY (MISCELLANEOUS) ×2 IMPLANT
DERMABOND ADVANCED (GAUZE/BANDAGES/DRESSINGS) ×1
DERMABOND ADVANCED .7 DNX12 (GAUZE/BANDAGES/DRESSINGS) ×1 IMPLANT
DRAPE ARM DVNC X/XI (DISPOSABLE) ×4 IMPLANT
DRAPE COLUMN DVNC XI (DISPOSABLE) ×1 IMPLANT
DRAPE DA VINCI XI ARM (DISPOSABLE) ×8
DRAPE DA VINCI XI COLUMN (DISPOSABLE) ×2
DRAPE UTILITY XL STRL (DRAPES) ×2 IMPLANT
DURAPREP 26ML APPLICATOR (WOUND CARE) ×2 IMPLANT
ELECT REM PT RETURN 9FT ADLT (ELECTROSURGICAL) ×2
ELECTRODE REM PT RTRN 9FT ADLT (ELECTROSURGICAL) ×1 IMPLANT
GAUZE 4X4 16PLY ~~LOC~~+RFID DBL (SPONGE) ×6 IMPLANT
GLOVE SURG ENC MOIS LTX SZ7 (GLOVE) IMPLANT
GLOVE SURG ENC MOIS LTX SZ7.5 (GLOVE) ×6 IMPLANT
GLOVE SURG POLYISO LF SZ6.5 (GLOVE) ×2 IMPLANT
GLOVE SURG POLYISO LF SZ7 (GLOVE) ×1 IMPLANT
GLOVE SURG POLYISO LF SZ7.5 (GLOVE) ×2 IMPLANT
GLOVE SURG UNDER POLY LF SZ7 (GLOVE) IMPLANT
HOLDER FOLEY CATH W/STRAP (MISCELLANEOUS) IMPLANT
IRRIG SUCT STRYKERFLOW 2 WTIP (MISCELLANEOUS) ×2
IRRIGATION SUCT STRKRFLW 2 WTP (MISCELLANEOUS) ×1 IMPLANT
IV NS 1000ML (IV SOLUTION) ×2
IV NS 1000ML BAXH (IV SOLUTION) IMPLANT
KIT TURNOVER CYSTO (KITS) ×2 IMPLANT
NEEDLE INSUFFLATION 150MM (ENDOMECHANICALS) ×2 IMPLANT
OBTURATOR OPTICAL STANDARD 8MM (TROCAR) ×2
OBTURATOR OPTICAL STND 8 DVNC (TROCAR) ×1
OBTURATOR OPTICALSTD 8 DVNC (TROCAR) ×1 IMPLANT
OCCLUDER COLPOPNEUMO (BALLOONS) ×2 IMPLANT
PACK ROBOT WH (CUSTOM PROCEDURE TRAY) ×2 IMPLANT
PACK ROBOTIC GOWN (GOWN DISPOSABLE) ×2 IMPLANT
PACK TRENDGUARD 450 HYBRID PRO (MISCELLANEOUS) IMPLANT
PAD OB MATERNITY 4.3X12.25 (PERSONAL CARE ITEMS) ×2 IMPLANT
PAD PREP 24X48 CUFFED NSTRL (MISCELLANEOUS) ×2 IMPLANT
PROTECTOR NERVE ULNAR (MISCELLANEOUS) ×4 IMPLANT
SEAL CANN UNIV 5-8 DVNC XI (MISCELLANEOUS) ×3 IMPLANT
SEAL XI 5MM-8MM UNIVERSAL (MISCELLANEOUS) ×6
SET IRRIG Y TYPE TUR BLADDER L (SET/KITS/TRAYS/PACK) IMPLANT
SET TRI-LUMEN FLTR TB AIRSEAL (TUBING) ×2 IMPLANT
SPONGE T-LAP 4X18 ~~LOC~~+RFID (SPONGE) ×2 IMPLANT
SUT VIC AB 0 CT1 27 (SUTURE) ×2
SUT VIC AB 0 CT1 27XBRD ANBCTR (SUTURE) ×1 IMPLANT
SUT VICRYL 0 UR6 27IN ABS (SUTURE) ×2 IMPLANT
SUT VICRYL RAPIDE 4/0 PS 2 (SUTURE) ×4 IMPLANT
SUT VLOC 180 0 9IN  GS21 (SUTURE) ×2
SUT VLOC 180 0 9IN GS21 (SUTURE) ×1 IMPLANT
TIP RUMI ORANGE 6.7MMX12CM (TIP) IMPLANT
TIP UTERINE 5.1X6CM LAV DISP (MISCELLANEOUS) IMPLANT
TIP UTERINE 6.7X10CM GRN DISP (MISCELLANEOUS) IMPLANT
TIP UTERINE 6.7X6CM WHT DISP (MISCELLANEOUS) ×1 IMPLANT
TIP UTERINE 6.7X8CM BLUE DISP (MISCELLANEOUS) IMPLANT
TOWEL OR 17X26 10 PK STRL BLUE (TOWEL DISPOSABLE) ×2 IMPLANT
TRENDGUARD 450 HYBRID PRO PACK (MISCELLANEOUS)
TROCAR PORT AIRSEAL 8X120 (TROCAR) ×2 IMPLANT
WATER STERILE IRR 1000ML POUR (IV SOLUTION) ×1 IMPLANT
WATER STERILE IRR 500ML POUR (IV SOLUTION) ×1 IMPLANT

## 2021-05-19 NOTE — H&P (Signed)
Taylor Chaney is an 45 y.o. female. Cervical adeno CA in situ confirmed by conization for definitive surgery.  Pertinent Gynecological History: Menses: flow is moderate Bleeding: na Contraception: vasectomy DES exposure: denies Blood transfusions: none Sexually transmitted diseases: no past history Previous GYN Procedures: DNC  Last mammogram: normal Date: 2022 Last pap: abnormal: see above  Date: 2022 OB History: G4, P2   Menstrual History: Menarche age: 1 No LMP recorded. (Menstrual status: Oral contraceptives).    Past Medical History:  Diagnosis Date   Adenocarcinoma in situ of endocervix    HGSIL (high grade squamous intraepithelial lesion) on Pap smear of cervix    History of kidney stones    passed stones - no surgery required   Wears glasses     Past Surgical History:  Procedure Laterality Date   CERVICAL CONIZATION W/BX N/A 07/22/2020   Procedure: CONIZATION CERVIX WITH BIOPSY;  Surgeon: Brien Few, MD;  Location: [facility];  Service: Gynecology;  Laterality: N/A;  Requests 45 min.   DILATION AND EVACUATION N/A 04/08/2014   Procedure: DILATATION AND EVACUATION;  Surgeon: Lovenia Kim, MD;  Location: [facility];  Service: Gynecology;  Laterality: N/A;   DILITATION & CURRETTAGE/HYSTROSCOPY WITH VERSAPOINT RESECTION N/A 06/22/2014   Procedure: DILATATION & CURETTAGE/HYSTEROSCOPY WITH VERSAPOINT RESECTION;  Surgeon: Lovenia Kim, MD;  Location: [facility];  Service: Gynecology;  Laterality: N/A;   TONSILLECTOMY  child    History reviewed. No pertinent family history.  Social History:  reports that she quit smoking about 30 years ago. Her smoking use included cigarettes. She has never used smokeless tobacco. She reports that she does not drink alcohol and does not use drugs.  Allergies: No Known Allergies  Medications Prior to Admission  Medication Sig Dispense Refill Last Dose   JUNEL FE 1/20 1-20 MG-MCG tablet Take 1 tablet by mouth at bedtime.   3     Review of Systems  Constitutional: Negative.   All other systems reviewed and are negative.  Height 4\' 9"  (1.448 m), weight 52.6 kg, unknown if currently breastfeeding. Physical Exam Constitutional:      Appearance: Normal appearance. She is normal weight.  HENT:     Head: Normocephalic and atraumatic.  Cardiovascular:     Rate and Rhythm: Normal rate and regular rhythm.     Pulses: Normal pulses.     Heart sounds: Normal heart sounds.  Pulmonary:     Effort: Pulmonary effort is normal.     Breath sounds: Normal breath sounds.  Genitourinary:    General: Normal vulva.  Musculoskeletal:        General: Normal range of motion.     Cervical back: Normal range of motion and neck supple.  Skin:    General: Skin is warm and dry.  Neurological:     General: No focal deficit present.     Mental Status: She is alert and oriented to person, place, and time.  Psychiatric:        Mood and Affect: Mood normal.        Behavior: Behavior normal.    No results found for this or any previous visit (from the past 24 hour(s)).  No results found.  Assessment/Plan: Cervical adenoca in situ by conization daVinci TLH and bilateral salpingectomy Consent done. Surgical risks discussed.   Xan Ingraham J 05/19/2021, 12:01 PM

## 2021-05-19 NOTE — Progress Notes (Signed)
Patient seen and examined. Consent witnessed and signed. No changes noted. Update completed.  BP (!) 167/104    Pulse 78    Temp 98 F (36.7 C) (Oral)    Resp 14    Ht 4\' 9"  (1.448 m)    Wt 51.7 kg    SpO2 99%    BMI 24.67 kg/m  CBC    Component Value Date/Time   WBC 8.2 05/18/2021 1031   RBC 4.52 05/18/2021 1031   HGB 13.3 05/18/2021 1031   HCT 40.6 05/18/2021 1031   PLT 388 05/18/2021 1031   MCV 89.8 05/18/2021 1031   MCH 29.4 05/18/2021 1031   MCHC 32.8 05/18/2021 1031   RDW 12.9 05/18/2021 1031   LYMPHSABS 3.4 10/14/2017 1930   MONOABS 0.4 10/14/2017 1930   EOSABS 0.2 10/14/2017 1930   BASOSABS 0.0 10/14/2017 1930

## 2021-05-19 NOTE — Anesthesia Preprocedure Evaluation (Signed)
Anesthesia Evaluation  Patient identified by MRN, date of birth, ID band Patient awake    Reviewed: Allergy & Precautions, NPO status , Patient's Chart, lab work & pertinent test results  History of Anesthesia Complications Negative for: history of anesthetic complications  Airway Mallampati: III  TM Distance: >3 FB Neck ROM: Full    Dental  (+) Dental Advisory Given, Teeth Intact   Pulmonary neg pulmonary ROS, former smoker,    Pulmonary exam normal        Cardiovascular negative cardio ROS Normal cardiovascular exam     Neuro/Psych negative neurological ROS     GI/Hepatic negative GI ROS, Neg liver ROS,   Endo/Other  negative endocrine ROS  Renal/GU negative Renal ROS  negative genitourinary   Musculoskeletal negative musculoskeletal ROS (+)   Abdominal   Peds  Hematology negative hematology ROS (+)   Anesthesia Other Findings   Reproductive/Obstetrics Endocervical Adenocarcinoma In Situ                             Anesthesia Physical Anesthesia Plan  ASA: 2  Anesthesia Plan: General   Post-op Pain Management: Tylenol PO (pre-op) and Toradol IV (intra-op)   Induction: Intravenous  PONV Risk Score and Plan: 4 or greater and Ondansetron, Dexamethasone, Treatment may vary due to age or medical condition and Midazolam  Airway Management Planned: Oral ETT  Additional Equipment: None  Intra-op Plan:   Post-operative Plan: Extubation in OR  Informed Consent: I have reviewed the patients History and Physical, chart, labs and discussed the procedure including the risks, benefits and alternatives for the proposed anesthesia with the patient or authorized representative who has indicated his/her understanding and acceptance.     Dental advisory given  Plan Discussed with:   Anesthesia Plan Comments:         Anesthesia Quick Evaluation

## 2021-05-19 NOTE — Op Note (Signed)
05/19/2021  2:59 PM  PATIENT:  Taylor Chaney  45 y.o. female  PRE-OPERATIVE DIAGNOSIS:  Endocervical Adenocarcinoma In Situ  POST-OPERATIVE DIAGNOSIS:  Endocervical Adenocarcinoma In Situ Left sigmoid adhesions to left adnexa and ovarian fossa Enterocele PROCEDURE:  Procedure(s): XI ROBOTIC ASSISTED LAPAROSCOPIC HYSTERECTOMY AND BILATERAL  SALPINGECTOMY LYSIS OF SIGMOID TO LEFT ADNEXAL ADHESIONS. MCCALL CUL DE PLASTY  SURGEON:  Surgeon(s): Brien Few, MD  ASSISTANTS: T. SUMNER RNFA   ANESTHESIA:   local and general  ESTIMATED BLOOD LOSS: 25 mL   DRAINS: Urinary Catheter (Foley)   LOCAL MEDICATIONS USED:  MARCAINE    and Amount: 20 ml  SPECIMEN:  Source of Specimen:  UTERUS , CERVIX AND BILATERAL TUBES  DISPOSITION OF SPECIMEN:  PATHOLOGY  COUNTS:  YES  DICTATION #: [id number]  PLAN OF CARE: DC HOME IN AM. 23 HR OBS  PATIENT DISPOSITION:  PACU - hemodynamically stable.

## 2021-05-19 NOTE — Anesthesia Procedure Notes (Signed)
Procedure Name: Intubation Date/Time: 05/19/2021 1:24 PM Performed by: Rogers Blocker, CRNA Pre-anesthesia Checklist: Patient identified, Emergency Drugs available, Suction available and Patient being monitored Patient Re-evaluated:Patient Re-evaluated prior to induction Oxygen Delivery Method: Circle System Utilized Preoxygenation: Pre-oxygenation with 100% oxygen Induction Type: IV induction Ventilation: Mask ventilation without difficulty Laryngoscope Size: Mac and 3 Grade View: Grade I Tube type: Oral Tube size: 7.0 mm Number of attempts: 1 Airway Equipment and Method: Stylet and Bite block Placement Confirmation: ETT inserted through vocal cords under direct vision, positive ETCO2 and breath sounds checked- equal and bilateral Secured at: 22 cm Tube secured with: Tape Dental Injury: Teeth and Oropharynx as per pre-operative assessment

## 2021-05-19 NOTE — Transfer of Care (Signed)
Immediate Anesthesia Transfer of Care Note  Patient: Taylor Chaney  Procedure(s) Performed: XI ROBOTIC ASSISTED LAPAROSCOPIC HYSTERECTOMY AND BILATERAL  SALPINGECTOMY (Bilateral: Abdomen)  Patient Location: PACU  Anesthesia Type:General  Level of Consciousness: drowsy, patient cooperative and responds to stimulation  Airway & Oxygen Therapy: Patient Spontanous Breathing and Patient connected to face mask oxygen  Post-op Assessment: Report given to RN and Post -op Vital signs reviewed and stable  Post vital signs: Reviewed and stable  Last Vitals:  Vitals Value Taken Time  BP 156/85 05/19/21 1515  Temp    Pulse 73 05/19/21 1517  Resp 17 05/19/21 1517  SpO2 100 % 05/19/21 1517  Vitals shown include unvalidated device data.  Last Pain:  Vitals:   05/19/21 1215  TempSrc: Oral         Complications: No notable events documented.

## 2021-05-20 ENCOUNTER — Encounter (HOSPITAL_BASED_OUTPATIENT_CLINIC_OR_DEPARTMENT_OTHER): Payer: Self-pay | Admitting: Obstetrics and Gynecology

## 2021-05-20 DIAGNOSIS — C530 Malignant neoplasm of endocervix: Secondary | ICD-10-CM | POA: Diagnosis not present

## 2021-05-20 LAB — BASIC METABOLIC PANEL
Anion gap: 9 (ref 5–15)
BUN: 11 mg/dL (ref 6–20)
CO2: 23 mmol/L (ref 22–32)
Calcium: 8.5 mg/dL — ABNORMAL LOW (ref 8.9–10.3)
Chloride: 102 mmol/L (ref 98–111)
Creatinine, Ser: 0.66 mg/dL (ref 0.44–1.00)
GFR, Estimated: >60 mL/min (ref >60)
Glucose, Bld: 144 mg/dL — ABNORMAL HIGH (ref 70–99)
Potassium: 4.1 mmol/L (ref 3.5–5.1)
Sodium: 134 mmol/L — ABNORMAL LOW (ref 135–145)

## 2021-05-20 LAB — CBC
HCT: 38.2 % (ref 36.0–46.0)
Hemoglobin: 12.8 g/dL (ref 12.0–15.0)
MCH: 29.6 pg (ref 26.0–34.0)
MCHC: 33.5 g/dL (ref 30.0–36.0)
MCV: 88.4 fL (ref 80.0–100.0)
Platelets: 360 10*3/uL (ref 150–400)
RBC: 4.32 MIL/uL (ref 3.87–5.11)
RDW: 12.8 % (ref 11.5–15.5)
WBC: 8.4 10*3/uL (ref 4.0–10.5)
nRBC: 0 % (ref 0.0–0.2)

## 2021-05-20 MED ORDER — OXYCODONE HCL 5 MG PO TABS
ORAL_TABLET | ORAL | Status: AC
  Filled 2021-05-20: qty 2

## 2021-05-20 MED ORDER — ACETAMINOPHEN 500 MG PO TABS
ORAL_TABLET | ORAL | Status: AC
  Filled 2021-05-20: qty 2

## 2021-05-20 MED ORDER — OXYCODONE-ACETAMINOPHEN 5-325 MG PO TABS: 1.0000 | ORAL_TABLET | ORAL | 0 refills | Status: AC | PRN

## 2021-05-20 MED ORDER — KETOROLAC TROMETHAMINE 30 MG/ML IJ SOLN
INTRAMUSCULAR | Status: AC
  Filled 2021-05-20: qty 1

## 2021-05-20 NOTE — Anesthesia Postprocedure Evaluation (Signed)
Anesthesia Post Note  Patient: Taylor Chaney  Procedure(s) Performed: XI ROBOTIC ASSISTED LAPAROSCOPIC HYSTERECTOMY AND BILATERAL  SALPINGECTOMY (Bilateral: Abdomen)     Patient location during evaluation: PACU Anesthesia Type: General Level of consciousness: awake and alert Pain management: pain level controlled Vital Signs Assessment: post-procedure vital signs reviewed and stable Respiratory status: spontaneous breathing, nonlabored ventilation, respiratory function stable and patient connected to nasal cannula oxygen Cardiovascular status: blood pressure returned to baseline and stable Postop Assessment: no apparent nausea or vomiting Anesthetic complications: no   No notable events documented.  Last Vitals:  Vitals:   05/20/21 0600 05/20/21 0841  BP: 131/63 136/79  Pulse: 65 (!) 59  Resp: 14 16  Temp: 36.8 C 36.6 C  SpO2: 100% 100%    Last Pain:  Vitals:   05/20/21 0841  TempSrc:   PainSc: 0-No pain   Pain Goal: Patients Stated Pain Goal: 4 (05/20/21 0200)                 Pari Lombard

## 2021-05-20 NOTE — Progress Notes (Signed)
1 Day Post-Op Procedure(s) (LRB): XI ROBOTIC ASSISTED LAPAROSCOPIC HYSTERECTOMY AND BILATERAL  SALPINGECTOMY (Bilateral)  Subjective: Patient reports nausea, incisional pain, tolerating PO, + flatus, and no problems voiding.    Objective: BP 131/63 (BP Location: Right Arm)    Pulse 65    Temp 98.2 F (36.8 C)    Resp 14    Ht 4\' 9"  (1.448 m)    Wt 51.7 kg    SpO2 100%    BMI 24.67 kg/m  CBC    Component Value Date/Time   WBC 8.4 05/20/2021 0211   RBC 4.32 05/20/2021 0211   HGB 12.8 05/20/2021 0211   HCT 38.2 05/20/2021 0211   PLT 360 05/20/2021 0211   MCV 88.4 05/20/2021 0211   MCH 29.6 05/20/2021 0211   MCHC 33.5 05/20/2021 0211   RDW 12.8 05/20/2021 0211   LYMPHSABS 3.4 10/14/2017 1930   MONOABS 0.4 10/14/2017 1930   EOSABS 0.2 10/14/2017 1930   BASOSABS 0.0 10/14/2017 1930    I have reviewed patient's vital signs, intake and output, medications, and labs.  General: alert, cooperative, and appears stated age 45 CTA CV;RRR Abd. Soft nt pos bs, incsion clean/ dry intact Ext no c/c/e Minimal bleeding  Assessment: s/p Procedure(s): XI ROBOTIC ASSISTED LAPAROSCOPIC HYSTERECTOMY AND BILATERAL  SALPINGECTOMY (Bilateral): stable, progressing well, and tolerating diet  Plan: Advance diet Encourage ambulation Discontinue IV fluids Discharge home  LOS: 0 days    Brendon Christoffel J 05/20/2021, 8:07 AM

## 2021-05-20 NOTE — Op Note (Signed)
Taylor Chaney, Taylor Chaney MEDICAL RECORD NO: [id number] ACCOUNT NO: [id number] DATE OF BIRTH: 05-03-1976 FACILITY: [facility] LOCATION: [facility] PHYSICIAN: Lovenia Kim, MD  Operative Report   DATE OF PROCEDURE: 05/19/2021  PREOPERATIVE DIAGNOSES:  Cervical adenocarcinoma in situ, high grade squamous intraepithelial lesion, both documented by cervical conization.  POSTOPERATIVE DIAGNOSES: 1.  Cervical adenocarcinoma in situ, high grade squamous intraepithelial lesion, both documented by cervical conization. 2.  Left adnexal to left sigmoid adhesions.  PROCEDURES:  Da Vinci-assisted total laparoscopic hysterectomy, bilateral salpingectomy, sharp lysis of sigmoid to left adnexal and left ovarian fossa adhesions.  SURGEON:  Lovenia Kim, MD  ASSISTANT:  Anson Crofts, RNFA  ANESTHESIA:  Local, general.  ESTIMATED BLOOD LOSS:  25 mL  COMPLICATIONS:  None.  DRAINS:  Foley.  COUNTS:  Correct.  DISPOSITION:  The patient to recovery in good condition.  SPECIMEN: Uterus, cervix and bilateral tubes.  BRIEF OPERATIVE NOTE:  After being apprised of the risks of anesthesia, infection, bleeding, injury to surrounding organs, possible need for repair, delayed verses immediate complications to include bowel and bladder injury and possible need for repair,  the patient was brought to the operating room where she was administered general anesthetic without complications.  She was prepped and draped in usual sterile fashion.  A Foley catheter placed.  Feet are placed in the Yellofin stirrups.  Exam under  anesthesia reveals a shortened cervix due to previous conization and stenotic external os.  Normal sized uterus, no adnexal masses.  A suture was placed a stay suture of 0 Vicryl in the anterior cervical lip and the posterior cervical lip.  Uterus sounds  to 8 cm and the RUMI retractor is placed without difficulty.  At this time,  infraumbilical incision was made with a scalpel.  Veress needle  placed.  Opening pressure of -2.  4 liters of CO2 insufflated without difficulty.  Trocar placed atraumatically.   Visualization reveals normal anterior and posterior cul-de-sacs.  Left sigmoid and left adnexal adhesions. Normal liver, gallbladder area.  Appendix not seen.  Omentum appears normal. At this time, two laparoscopic ports made on the left and one on the  right. Robotic trocars and AirSeal trocars were placed under direct visualization without difficulty.  Deep Trendelenburg position established, robot docked in the standard fashion with placement of bipolar forceps and EndoShears.  At this time, ureters  identified bilaterally after the left adnexal adhesions are sharply lysed to expose an otherwise normal-appearing left adnexa.  Minimal bleeding is noted.  Left ureter noted.  The left tube is undermined, cauterized using bipolar and unipolar cautery  and removed intact through the operative port. On the right tube is done the same.  Right tube was also removed after undermining the tube and is removed separately.  The retroperitoneal space was entered on the left, the ureter was identified.  The  ovarian ligament is cauterized using bipolar cautery and cut.  The round ligament was divided using bipolar cautery and unipolar cautery.  The uterine vessels were skeletonized. On the left bladder flap developed sharply. On the right round ligament is  also entered. Due to the high vascularity on the right side, the ovarian ligament is taken without entering the retroperitoneal space and this divided the round ligament.  The bladder flap further developed on the right sharply, the uterine vessel  skeletonized on the right.  Ureter identified. At this time, uterine vessels on both sides were cauterized using bipolar cautery and cut.  The anterior flap at cervicovaginal junction  is well developed. Posteriorly the cervicovaginal junction is scored  and the specimen is divided at the cervicovaginal  junction using unipolar cautery and retracted in the vagina.  The occluder is placed.  Hemostasis was achieved using the unipolar and bipolar cautery. The vaginal cuff was closed in 2 layers using a 0  V-Loc suture in a continuous running fashion.  McCall culdoplasty suture is placed due to apparent enterocele.  Irrigation accomplished.  Good hemostasis noted.  Ropivacaine placed.  Both ovaries appeared normal and hemostatic.  At this time, the  procedure was terminated.  Robot was undocked.  Ports were removed, CO2 released.  Positive pressure applied.  Incisions closed using 0 Vicryl, 4-0 Vicryl and Dermabond.  Vaginal exam reveals a well-approximated vaginal cuff, well supported.  No evidence  of recurrent enterocele.  The patient tolerated the procedure well, was awakened and transferred to recovery in good condition.   VAI D: 05/19/2021 3:06:03 pm T: 05/20/2021 1:08:00 am  JOB: [id number]/ [id number]

## 2021-05-23 LAB — SURGICAL PATHOLOGY
